# Patient Record
Sex: Male | Born: 1990
Health system: Southern US, Community
[De-identification: ages and names within clinical notes are randomized; demographics above are authoritative.]

## PROBLEM LIST (undated history)

## (undated) DIAGNOSIS — E119 Type 2 diabetes mellitus without complications: Secondary | ICD-10-CM

## (undated) HISTORY — DX: Type 2 diabetes mellitus without complications: E11.9

## (undated) HISTORY — PX: KNEE ARTHROSCOPY: SUR90

---
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
## 2010-03-14 IMAGING — CR DG FOOT COMPLETE 3+V*L*
1 series · 3 of 3 positions shown · non-contrast
Comparison: none

REASON FOR EXAM: PAIN IN LT FOOT AND ANKLE
COMMENTS:

PROCEDURE:     DXR - DXR FOOT LT COMP W/OBLIQUES  - August 10, 2008  [DATE]
RESULT:       There is no evidence of fracture, dislocation or malalignment.
 If there are persistent complaints of pain or persistent clinical concern,
a repeat evaluation in 7-10 days is recommended.

[Series 1: view not recorded · 0.17mm/px · 3 of 3 slices shown]
[im 1/3]
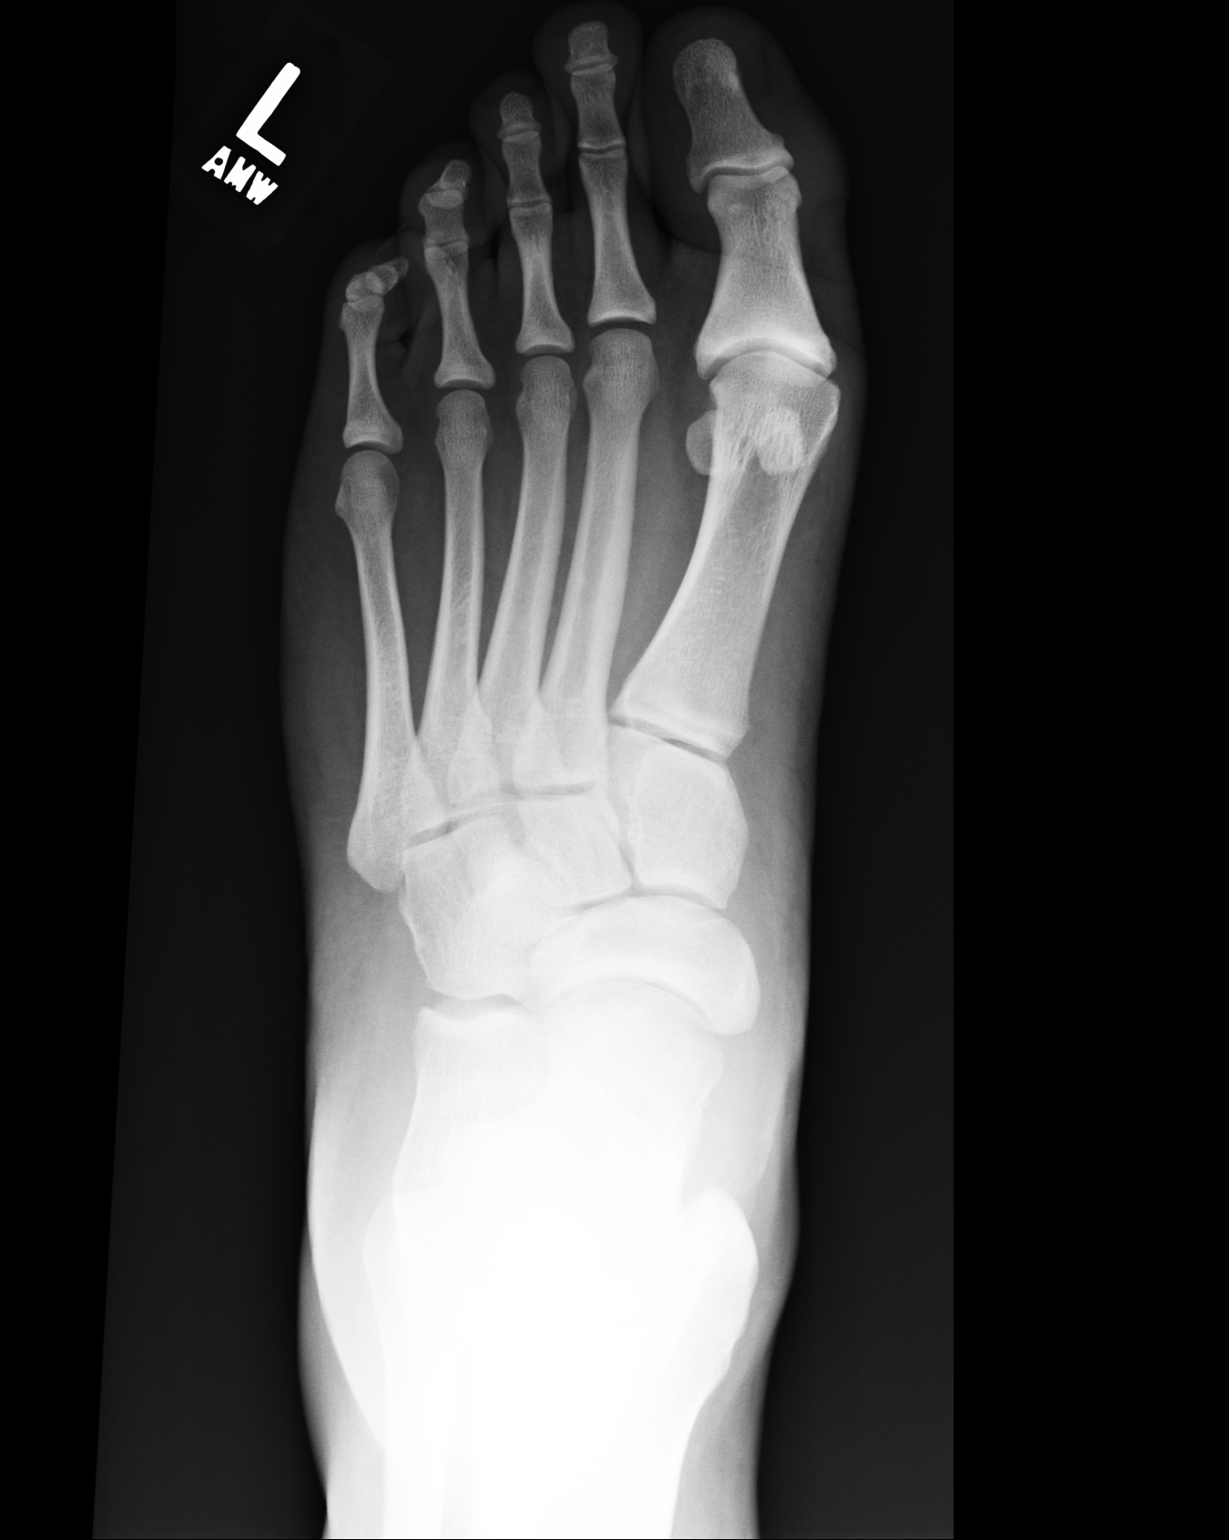
[im 2/3]
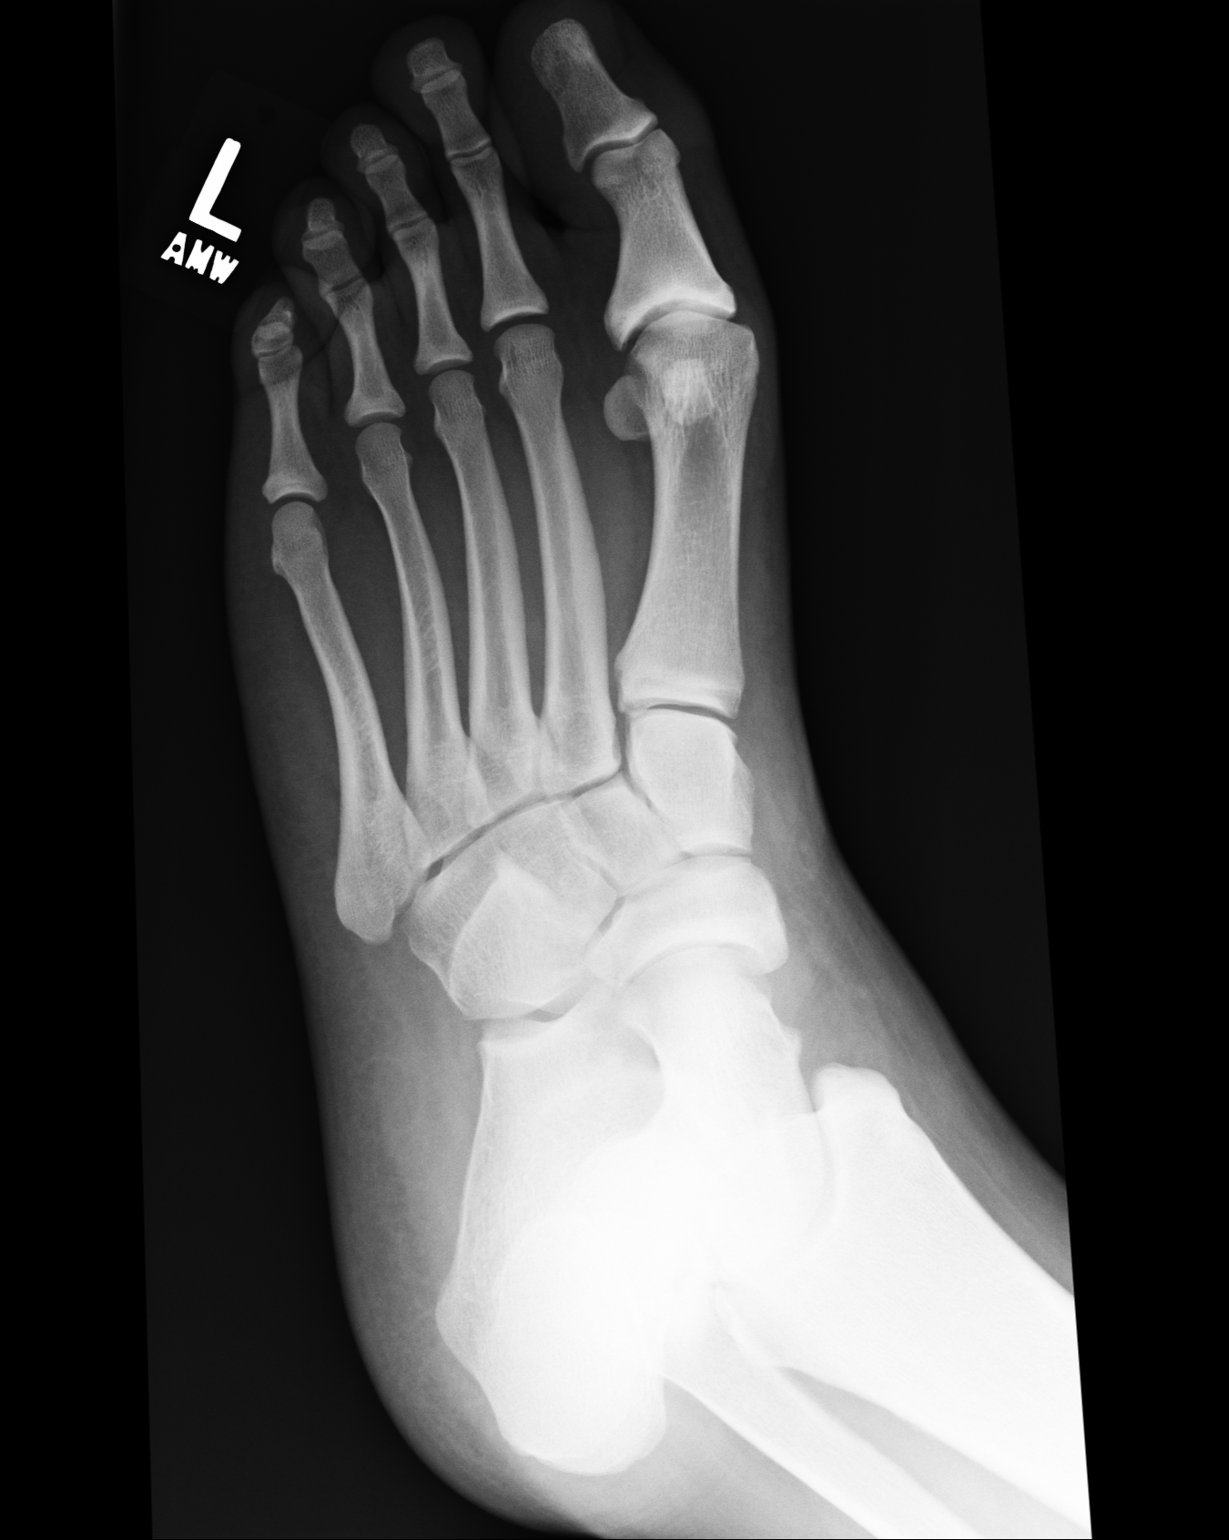
[im 3/3]
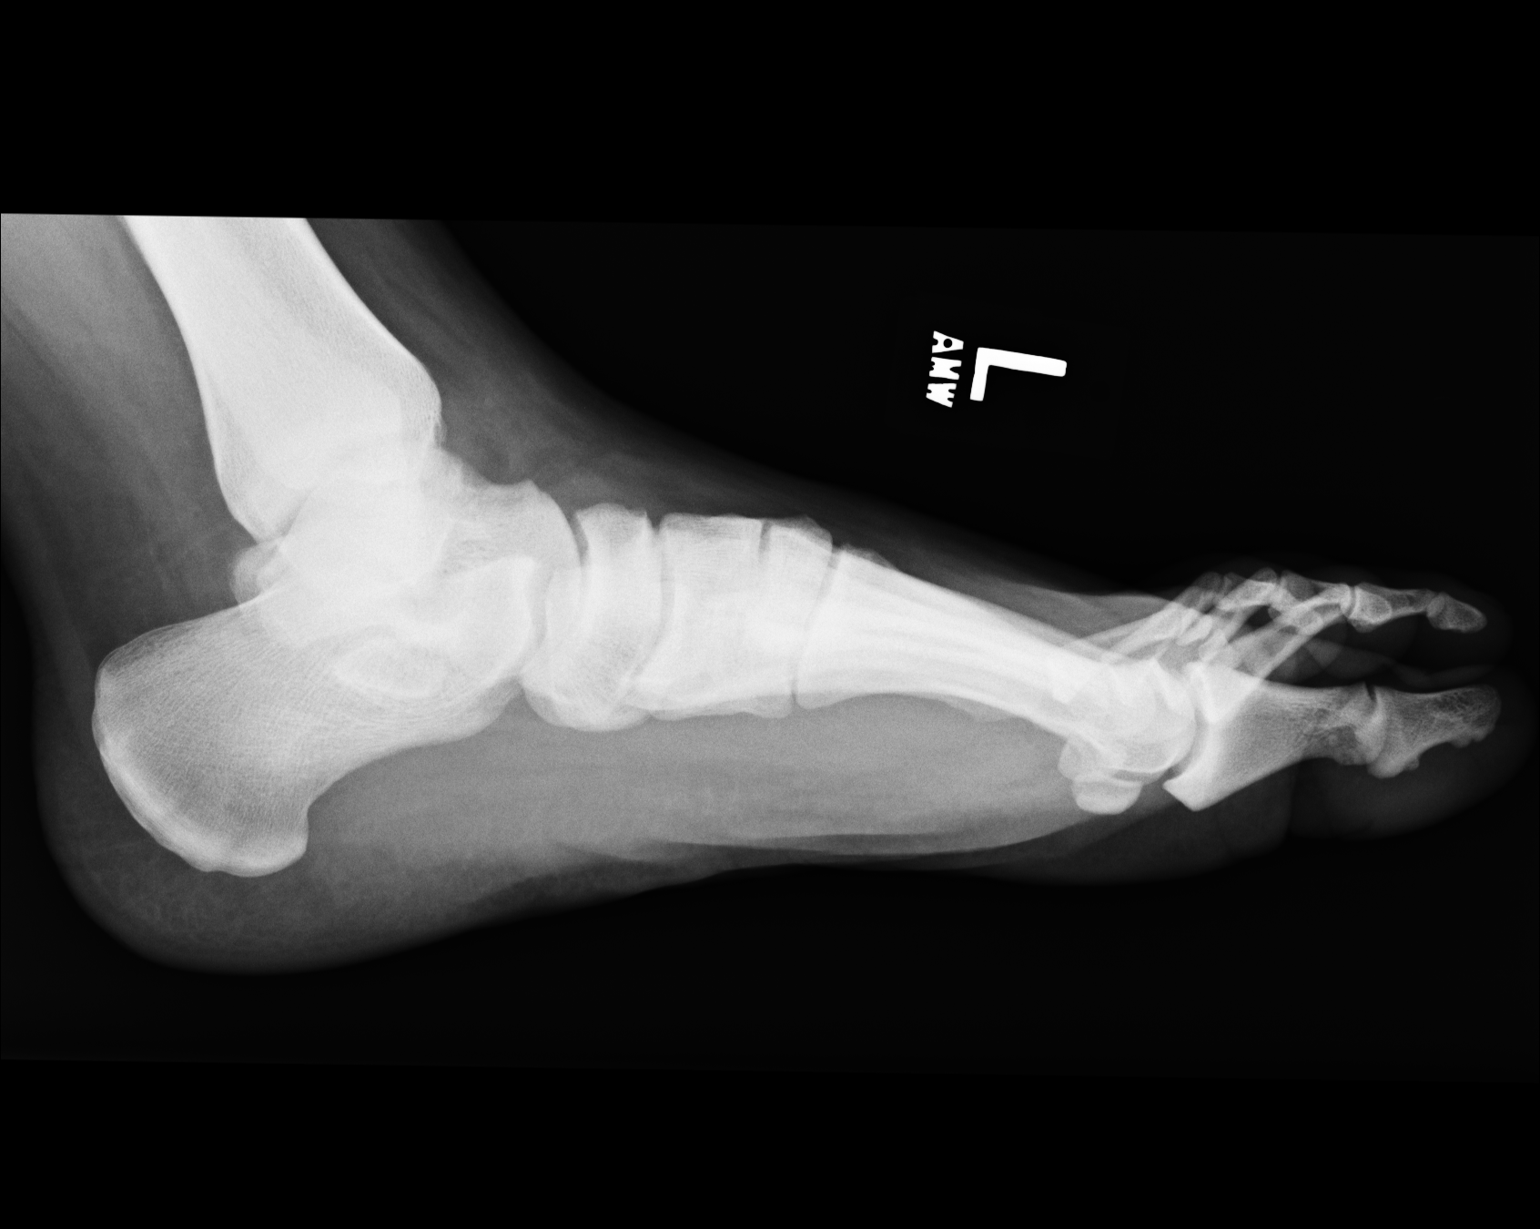

[3 of 3 positions shown; findings below may reference images not displayed]

IMPRESSION: No evidence of focal acute abnormalities of the left foot as described
above.

## 2014-09-04 DIAGNOSIS — R7309 Other abnormal glucose: Secondary | ICD-10-CM | POA: Insufficient documentation

## 2019-07-24 ENCOUNTER — Telehealth: Payer: Self-pay

## 2019-07-24 NOTE — Telephone Encounter (Signed)
Robert Whitaker will you check with Marcelino Duster if she would be willing to take this gentleman as a new patient.  He moved here from [facility] and has no provider.  If so will you see that arrangements are made for his visit?  Thank you

## 2019-07-24 NOTE — Telephone Encounter (Signed)
Scheduled patient in 09/05/2018

## 2019-07-24 NOTE — Telephone Encounter (Signed)
I spoke with Robert Whitaker.  He is from [place] and just moved here.  His insurance carrier had you on his list of providers that are taking patients.

## 2019-07-24 NOTE — Telephone Encounter (Signed)
Yeah unfortunately I am not taking new patients at this time, but maybe Marcelino Duster could establish him here.   And I think Dr. Leonard Schwartz and Ricki Rodriguez are opening up [facility] also this month.

## 2019-07-24 NOTE — Telephone Encounter (Signed)
Copied from CRM [id number]. Topic: Appointment Scheduling - Scheduling Inquiry for Clinic >> Jul 24, 2019 10:29 AM Dalphine Handing A wrote: Patient is requesting to be taken on as new patient with Joycelyn Man and also requesting a physical. Please advise

## 2019-07-24 NOTE — Telephone Encounter (Signed)
Looks like this patient has not been anywhere in [facility] system.  Chart was made on him at the time of the call.    Have left a message so I could talk to him about who he has been seeing and how he came to call and inquire about Antony Contras.

## 2019-07-24 NOTE — Telephone Encounter (Signed)
Was this patient referred by anyone?  JB

## 2019-08-27 NOTE — Progress Notes (Signed)
New patient visit    Patient: Robert Whitaker   DOB: June 15, 1990   28 y.o. Male  MRN: [id number] Visit Date: 09/05/2019  Today's healthcare provider: Jairo Ben, FNP   Rae Lips as a scribe for Beverely Pace [name], FNP.,have documented all relevant documentation on the behalf of [name] [name], FNP,as directed by  Jairo Ben, FNP while in the presence of Jairo Ben, FNP. Subjective:    Chief Complaint  Patient presents with  . New Patient (Initial Visit)    Robert Whitaker is a 29 y.o. male who presents today as a new patient to establish care.  HPI  Patient reports that he has concerns of left elbow pain that has been present for over 30 days, he states that he is predominantly left handed.   Patient does state that he has had a previous injury to elbow when he played football in past. Patient reports he notices pain more when exerting or long periods of driving. Patient states that is is a constant ache and denies radiation, but states that when pain starts he has numbness in his left ring and pinky finger that has been present for a year. Patient denies swelling of joint, he states that he does play putt putt and has had long use typing.  He has a history of meniscus tear in his right knee, he did not require surgery he reports.  He also feels like he has injured his left knee in the past.  He has right foot pain at times from previous sports injuries.  He would like to be STD tested today.  He has no symptoms currently.  Patient  denies any fever, body aches,chills, rash, chest pain, shortness of breath, nausea, vomiting, or diarrhea.    History reviewed. No pertinent past medical history. History reviewed. No pertinent surgical history. No family status information on file.   History reviewed. No pertinent family history. Social History   Socioeconomic History  . Marital status: Single    Spouse name:  Not on file  . Number of children: Not on file  . Years of education: Not on file  . Highest education level: Not on file  Occupational History  . Not on file  Tobacco Use  . Smoking status: Never Smoker  . Smokeless tobacco: Never Used  Substance and Sexual Activity  . Alcohol use: Not Currently  . Drug use: Never  . Sexual activity: Not on file  Other Topics Concern  . Not on file  Social History Narrative  . Not on file   Social Determinants of Health   Financial Resource Strain:   . Difficulty of Paying Living Expenses:   Food Insecurity:   . Worried About Programme researcher, broadcasting/film/video in the Last Year:   . Barista in the Last Year:   Transportation Needs:   . Freight forwarder (Medical):   Marland Kitchen Lack of Transportation (Non-Medical):   Physical Activity:   . Days of Exercise per Week:   . Minutes of Exercise per Session:   Stress:   . Feeling of Stress :   Social Connections:   . Frequency of Communication with Friends and Family:   . Frequency of Social Gatherings with Friends and Family:   . Attends Religious Services:   . Active Member of Clubs or Organizations:   . Attends Banker Meetings:   Marland Kitchen Marital Status:    No outpatient medications prior to  visit.   No facility-administered medications prior to visit.   Allergies  Allergen Reactions  . Bee Venom Swelling    Patient Care Team: Doreen Beam, FNP as PCP - General (Family Medicine)  Review of Systems  Constitutional: Negative for activity change, appetite change, chills, diaphoresis, fatigue, fever and unexpected weight change.  HENT: Negative.   Eyes: Negative.        He has regular eye exams.  Respiratory: Negative for apnea, cough, choking, chest tightness, shortness of breath, wheezing and stridor.   Cardiovascular: Negative for chest pain, palpitations and leg swelling.  Gastrointestinal: Negative.   Genitourinary: Negative.   Musculoskeletal: Positive for arthralgias  and joint swelling. Negative for back pain, gait problem, myalgias, neck pain and neck stiffness.       Chronic history of sports injuries bilateral knees.  Right foot pain, history of Achilles tear in the past.  Skin: Negative.   Allergic/Immunologic: Positive for environmental allergies (Allergic to bee venom, never had an anaphylactic reaction in the past but does have an epinepherine pen  is aware how to use it.).  Neurological: Positive for numbness (Small digit and ring finger of left hand.). Negative for dizziness, tremors, seizures, syncope, facial asymmetry, speech difficulty, weakness, light-headedness and headaches.  Hematological: Negative.   Psychiatric/Behavioral: Negative.   All other systems reviewed and are negative.   Last CBC No results found for: WBC, HGB, HCT, MCV, MCH, RDW, PLT Last metabolic panel No results found for: GLUCOSE, NA, K, CL, CO2, BUN, CREATININE, GFRNONAA, GFRAA, CALCIUM, PHOS, PROT, ALBUMIN, LABGLOB, AGRATIO, BILITOT, ALKPHOS, AST, ALT, ANIONGAP Last lipids No results found for: CHOL, HDL, LDLCALC, LDLDIRECT, TRIG, CHOLHDL Last hemoglobin A1c No results found for: HGBA1C Last thyroid functions No results found for: TSH, T3TOTAL, T4TOTAL, THYROIDAB Last vitamin D No results found for: 25OHVITD2, 25OHVITD3, VD25OH Last vitamin B12 and Folate No results found for: VITAMINB12, FOLATE     Objective:    BP 134/84   Pulse (!) 103   Temp 98.2 F (36.8 C) (Oral)   Resp 16   Ht 5\' 10"  (1.778 m)   Wt (!) 346 lb (156.9 kg)   SpO2 94%   BMI 49.65 kg/m  Physical Exam Vitals reviewed. Exam conducted with a chaperone present.  Constitutional:      General: He is not in acute distress.    Appearance: Normal appearance. He is obese. He is not ill-appearing, toxic-appearing or diaphoretic.  HENT:     Head: Normocephalic and atraumatic.     Right Ear: Tympanic membrane, ear canal and external ear normal. There is no impacted cerumen.     Left Ear: There  is impacted cerumen (hard dark cerumen, unable to visualize tympanic membrane).     Nose: Nose normal. No congestion or rhinorrhea.     Mouth/Throat:     Mouth: Mucous membranes are moist.     Pharynx: Oropharynx is clear. No oropharyngeal exudate or posterior oropharyngeal erythema.  Eyes:     General: No scleral icterus.    Extraocular Movements: Extraocular movements intact.     Conjunctiva/sclera: Conjunctivae normal.     Pupils: Pupils are equal, round, and reactive to light.  Cardiovascular:     Rate and Rhythm: Normal rate and regular rhythm.     Pulses: Normal pulses.     Heart sounds: Normal heart sounds. No murmur. No friction rub. No gallop.   Pulmonary:     Effort: Pulmonary effort is normal. No respiratory distress.  Breath sounds: Normal breath sounds. No stridor. No wheezing, rhonchi or rales.  Chest:     Chest wall: No tenderness.  Abdominal:     General: Bowel sounds are normal. There is no distension.     Palpations: Abdomen is soft.     Tenderness: There is no abdominal tenderness. There is no right CVA tenderness, left CVA tenderness or guarding.  Genitourinary:    Penis: Normal.      Testes: Normal. Cremasteric reflex is present.        Right: Mass, tenderness or swelling not present.        Left: Mass, tenderness or swelling not present.     Epididymis:     Right: Normal. Not inflamed. No tenderness.     Left: Normal. Not inflamed or enlarged. No mass or tenderness.     Tanner stage (genital): 5.  Musculoskeletal:        General: No tenderness or deformity. Normal range of motion.     Cervical back: Normal range of motion and neck supple.     Comments: Patient reports swelling in left knee intermittent.  history of right meniscus tear.   Skin:    General: Skin is warm and dry.     Capillary Refill: Capillary refill takes less than 2 seconds.  Neurological:     General: No focal deficit present.     Mental Status: He is alert and oriented to person,  place, and time. Mental status is at baseline.     Cranial Nerves: No cranial nerve deficit.     Sensory: No sensory deficit.     Motor: No weakness.     Coordination: Coordination normal.     Gait: Gait normal.     Deep Tendon Reflexes: Reflexes normal.  Psychiatric:        Mood and Affect: Mood normal.        Behavior: Behavior normal.        Thought Content: Thought content normal.        Judgment: Judgment normal.   Negative Phalen's and Tinel's test in the office.  He does have numbness of his left finger and ring finger at times no numbness currently. He has a history of multiple sores on injuries referral to orthopedics will be made.  Patient appers well, not sickly. Speaking in complete sentences. Patient moves on and off of exam table and in room without difficulty. Gait is normal in hall and in room. Patient is oriented to person place time and situation. Patient answers questions appropriately and engages eye contact and verbal dialect with provider.   No results found for any visits on 09/05/19.    Assessment & Plan:      Encounter for routine adult health examination without abnormal findings - Plan: Comprehensive metabolic panel  Body mass index (BMI) of 45.0-49.9 in adult [facility]) - Plan: Lipid panel, TSH  Elevated hemoglobin A1c - Plan: CBC with Differential/Platelet, Hemoglobin A1c  Left knee pain, unspecified chronicity - Plan: Ambulatory referral to Orthopedic Surgery  Screening for STD (sexually transmitted disease) - Plan: HIV antibody (with reflex), RPR, Urine cytology ancillary only, HSV(herpes simplex vrs) 1+2 ab-IgM, Hepatitis, Acute  Morbid obesity (HCC) - Plan: Comprehensive metabolic panel, Lipid panel, TSH  Bee allergy status - Plan: EPINEPHrine 0.3 mg/0.3 mL IJ SOAJ injection  - EPINEPHrine 0.3 mg/0.3 mL IJ SOAJ injection; Inject 0.3 mLs (0.3 mg total) into the muscle as needed for anaphylaxis.  Dispense: 1 each; Refill: 1 Instructions given on  use and  calling 911 if used.  Advised to review with pharmacy at pick up as well.  Inform office if has used.   The counter anti-inflammatories recommended, carpal tunnel brace to left wrist and carpal tunnel exercises given.  If not improving will have him follow-up with orthopedic hand surgeon.  Will let me know if he needs a referral.  Or if symptoms or not improving or worsening. He denies any shoulder or neck pain. He is aware that it is not heard from orthopedic regarding his orthopedic referral within 2 weeks that he will give the office a call to inform us.  Visit summary was given and reviewed. Advised patient call the office or your primary care doctor for an appointment if no improvement within 72 hours or if any symptoms change or worsen at any time  Advised ER or urgent Care if after hours or on weekend. Call 911 for emergency symptoms at any time.Patinet verbalized understanding of all instructions given/reviewed and treatment plan and has no further questions or concerns at this time.   Return in about 3 months (around 12/05/2019), or if symptoms worsen or fail to improve, for at any time for any worsening symptoms, Go to Emergency room/ urgent care if worse. IBeverely Pace Ethridge Sollenberger, FNP, have reviewed all documentation for this visit. The documentation on 09/05/19 for the exam, diagnosis, procedures, and orders are all accurate and complete.   Jairo Ben, FNP  [facility] [phone] (phone) [phone] (fax)  [facility]

## 2019-09-05 ENCOUNTER — Encounter: Payer: Self-pay | Admitting: Adult Health

## 2019-09-05 ENCOUNTER — Ambulatory Visit (INDEPENDENT_AMBULATORY_CARE_PROVIDER_SITE_OTHER): Payer: Managed Care, Other (non HMO) | Admitting: Adult Health

## 2019-09-05 ENCOUNTER — Other Ambulatory Visit: Payer: Self-pay

## 2019-09-05 ENCOUNTER — Other Ambulatory Visit (HOSPITAL_COMMUNITY)
Admission: RE | Admit: 2019-09-05 | Discharge: 2019-09-05 | Disposition: A | Discharge status: Home / Self Care | Payer: Managed Care, Other (non HMO) | Source: Ambulatory Visit | Attending: Adult Health | Admitting: Adult Health

## 2019-09-05 VITALS — BP 134/84 | HR 103 | Temp 98.2°F | Resp 16 | Ht 70.0 in | Wt 346.0 lb

## 2019-09-05 DIAGNOSIS — Z6841 Body Mass Index (BMI) 40.0 and over, adult: Secondary | ICD-10-CM

## 2019-09-05 DIAGNOSIS — Z0000 Encounter for general adult medical examination without abnormal findings: Secondary | ICD-10-CM

## 2019-09-05 DIAGNOSIS — M25562 Pain in left knee: Secondary | ICD-10-CM | POA: Diagnosis not present

## 2019-09-05 DIAGNOSIS — Z113 Encounter for screening for infections with a predominantly sexual mode of transmission: Secondary | ICD-10-CM | POA: Diagnosis present

## 2019-09-05 DIAGNOSIS — Z91030 Bee allergy status: Secondary | ICD-10-CM | POA: Insufficient documentation

## 2019-09-05 DIAGNOSIS — R7309 Other abnormal glucose: Secondary | ICD-10-CM | POA: Diagnosis not present

## 2019-09-05 MED ORDER — EPINEPHRINE 0.3 MG/0.3ML IJ SOAJ: 0.3000 mg | INTRAMUSCULAR | 1 refills | Status: DC | PRN

## 2019-09-05 NOTE — Patient Instructions (Addendum)
Health Maintenance, Male Adopting a healthy lifestyle and getting preventive care are important in promoting health and wellness. Ask your health care provider about:  The right schedule for you to have regular tests and exams.  Things you can do on your own to prevent diseases and keep yourself healthy. What should I know about diet, weight, and exercise? Eat a healthy diet   Eat a diet that includes plenty of vegetables, fruits, low-fat dairy products, and lean protein.  Do not eat a lot of foods that are high in solid fats, added sugars, or sodium. Maintain a healthy weight Body mass index (BMI) is a measurement that can be used to identify possible weight problems. It estimates body fat based on height and weight. Your health care provider can help determine your BMI and help you achieve or maintain a healthy weight. Get regular exercise Get regular exercise. This is one of the most important things you can do for your health. Most adults should:  Exercise for at least 150 minutes each week. The exercise should increase your heart rate and make you sweat (moderate-intensity exercise).  Do strengthening exercises at least twice a week. This is in addition to the moderate-intensity exercise.  Spend less time sitting. Even light physical activity can be beneficial. Watch cholesterol and blood lipids Have your blood tested for lipids and cholesterol at 30 years of age, then have this test every 5 years. You may need to have your cholesterol levels checked more often if:  Your lipid or cholesterol levels are high.  You are older than 30 years of age.  You are at high risk for heart disease. What should I know about cancer screening? Many types of cancers can be detected early and may often be prevented. Depending on your health history and family history, you may need to have cancer screening at various ages. This may include screening for:  Colorectal cancer.  Prostate  cancer.  Skin cancer.  Lung cancer. What should I know about heart disease, diabetes, and high blood pressure? Blood pressure and heart disease  High blood pressure causes heart disease and increases the risk of stroke. This is more likely to develop in people who have high blood pressure readings, are of African descent, or are overweight.  Talk with your health care provider about your target blood pressure readings.  Have your blood pressure checked: ? Every 3-5 years if you are 18-39 years of age. ? Every year if you are 40 years old or older.  If you are between the ages of 65 and 75 and are a current or former smoker, ask your health care provider if you should have a one-time screening for abdominal aortic aneurysm (AAA). Diabetes Have regular diabetes screenings. This checks your fasting blood sugar level. Have the screening done:  Once every three years after age 45 if you are at a normal weight and have a low risk for diabetes.  More often and at a younger age if you are overweight or have a high risk for diabetes. What should I know about preventing infection? Hepatitis B If you have a higher risk for hepatitis B, you should be screened for this virus. Talk with your health care provider to find out if you are at risk for hepatitis B infection. Hepatitis C Blood testing is recommended for:  Everyone born from 1945 through 1965.  Anyone with known risk factors for hepatitis C. Sexually transmitted infections (STIs)  You should be screened each year   for STIs, including gonorrhea and chlamydia, if: ? You are sexually active and are younger than 30 years of age. ? You are older than 30 years of age and your health care provider tells you that you are at risk for this type of infection. ? Your sexual activity has changed since you were last screened, and you are at increased risk for chlamydia or gonorrhea. Ask your health care provider if you are at risk.  Ask your  health care provider about whether you are at high risk for HIV. Your health care provider may recommend a prescription medicine to help prevent HIV infection. If you choose to take medicine to prevent HIV, you should first get tested for HIV. You should then be tested every 3 months for as long as you are taking the medicine. Follow these instructions at home: Lifestyle  Do not use any products that contain nicotine or tobacco, such as cigarettes, e-cigarettes, and chewing tobacco. If you need help quitting, ask your health care provider.  Do not use street drugs.  Do not share needles.  Ask your health care provider for help if you need support or information about quitting drugs. Alcohol use  Do not drink alcohol if your health care provider tells you not to drink.  If you drink alcohol: ? Limit how much you have to 0-2 drinks a day. ? Be aware of how much alcohol is in your drink. In the [place] one drink equals one 12 oz bottle of beer (355 mL), one 5 oz glass of wine (148 mL), or one 1 oz glass of hard liquor (44 mL). General instructions  Schedule regular health, dental, and eye exams.  Stay current with your vaccines.  Tell your health care provider if: ? You often feel depressed. ? You have ever been abused or do not feel safe at home. Summary  Adopting a healthy lifestyle and getting preventive care are important in promoting health and wellness.  Follow your health care provider's instructions about healthy diet, exercising, and getting tested or screened for diseases.  Follow your health care provider's instructions on monitoring your cholesterol and blood pressure. This information is not intended to replace advice given to you by your health care provider. Make sure you discuss any questions you have with your health care provider. Document Revised: 04/17/2018 Document Reviewed: 04/17/2018 Elsevier Patient Education  2020 [employer].   Epinephrine injection  (Auto-injector) What is this medicine? EPINEPHRINE (ep i NEF rin) is used for the emergency treatment of severe allergic reactions. You should keep this medicine with you at all times. This medicine may be used for other purposes; ask your health care provider or pharmacist if you have questions. COMMON BRAND NAME(S): Adrenaclick, Auvi-Q, Epinephrine Professional EMS, epinephrinesnap, epinephrinesnap-v, EpiPen, EPIsnap Epinephrine, SYMJEPI, Twinject What should I tell my health care provider before I take this medicine? They need to know if you have any of the following conditions:  diabetes  heart disease  high blood pressure  lung or breathing disease, like asthma  Parkinson's disease  thyroid disease  an unusual or allergic reaction to epinephrine, sulfites, other medicines, foods, dyes, or preservatives  pregnant or trying to get pregnant  breast-feeding How should I use this medicine? This medicine is for injection into the outer thigh. Your doctor or health care professional will instruct you on the proper use of the device during an emergency. Read all directions carefully and make sure you understand them. Do not use more often than  directed. Talk to your pediatrician regarding the use of this medicine in children. Special care may be needed. This drug is commonly used in children. A special device is available for use in children. If you are giving this medicine to a young child, hold their leg firmly in place before and during the injection to prevent injury. Overdosage: If you think you have taken too much of this medicine contact a poison control center or emergency room at once. NOTE: This medicine is only for you. Do not share this medicine with others. What if I miss a dose? This does not apply. You should only use this medicine for an allergic reaction. What may interact with this medicine? This medicine is only used during an emergency. Significant drug interactions  are not likely during emergency use. This list may not describe all possible interactions. Give your health care provider a list of all the medicines, herbs, non-prescription drugs, or dietary supplements you use. Also tell them if you smoke, drink alcohol, or use illegal drugs. Some items may interact with your medicine. What should I watch for while using this medicine? Keep this medicine ready for use in the case of a severe allergic reaction. Make sure that you have the phone number of your doctor or health care professional and local hospital ready. Remember to check the expiration date of your medicine regularly. You may need to have additional units of this medicine with you at work, school, or other places. Talk to your doctor or health care professional about your need for extra units. Some emergencies may require an additional dose. Check with your doctor or a health care professional before using an extra dose. After use, go to the nearest hospital or call 911. Avoid physical activity. Make sure the treating health care professional knows you have received an injection of this medicine. You will receive additional instructions on what to do during and after use of this medicine before a medical emergency occurs. What side effects may I notice from receiving this medicine? Side effects that you should report to your doctor or health care professional as soon as possible:  allergic reactions like skin rash, itching or hives, swelling of the face, lips, or tongue  breathing problems  chest pain  fast, irregular heartbeat  pain, tingling, numbness in the hands or feet  pain, redness, or irritation at site where injected  vomiting Side effects that usually do not require medical attention (report to your doctor or health care professional if they continue or are bothersome):  anxious  dizziness  dry mouth  headache  increased sweating  nausea  unusually weak or tired This  list may not describe all possible side effects. Call your doctor for medical advice about side effects. You may report side effects to FDA at 1-800-FDA-1088. Where should I keep my medicine? Keep out of the reach of children. Store at room temperature between 15 and 30 degrees C (59 and 86 degrees F). Protect from light and heat. The solution should be clear in color. If the solution is discolored or contains particles it must be replaced. Throw away any unused medicine after the expiration date. Ask your doctor or pharmacist about proper disposal of the injector if it is expired or has been used. Always replace your auto-injector before it expires. NOTE: This sheet is a summary. It may not cover all possible information. If you have questions about this medicine, talk to your doctor, pharmacist, or health care provider.  2020  Elsevier/Gold Standard (2014-09-28 12:24:50)    Earwax Buildup, Adult The ears produce a substance called earwax that helps keep bacteria out of the ear and protects the skin in the ear canal. Occasionally, earwax can build up in the ear and cause discomfort or hearing loss. What increases the risk? This condition is more likely to develop in people who:  Are male.  Are elderly.  Naturally produce more earwax.  Clean their ears often with cotton swabs.  Use earplugs often.  Use in-ear headphones often.  Wear hearing aids.  Have narrow ear canals.  Have earwax that is overly thick or sticky.  Have eczema.  Are dehydrated.  Have excess hair in the ear canal. What are the signs or symptoms? Symptoms of this condition include:  Reduced or muffled hearing.  A feeling of fullness in the ear or feeling that the ear is plugged.  Fluid coming from the ear.  Ear pain.  Ear itch.  Ringing in the ear.  Coughing.  An obvious piece of earwax that can be seen inside the ear canal. How is this diagnosed? This condition may be diagnosed based  on:  Your symptoms.  Your medical history.  An ear exam. During the exam, your health care provider will look into your ear with an instrument called an otoscope. You may have tests, including a hearing test. How is this treated? This condition may be treated by:  Using ear drops to soften the earwax.  Having the earwax removed by a health care provider. The health care provider may: ? Flush the ear with water. ? Use an instrument that has a loop on the end (curette). ? Use a suction device.  Surgery to remove the wax buildup. This may be done in severe cases. Follow these instructions at home:   Take over-the-counter and prescription medicines only as told by your health care provider.  Do not put any objects, including cotton swabs, into your ear. You can clean the opening of your ear canal with a washcloth or facial tissue.  Follow instructions from your health care provider about cleaning your ears. Do not over-clean your ears.  Drink enough fluid to keep your urine clear or pale yellow. This will help to thin the earwax.  Keep all follow-up visits as told by your health care provider. If earwax builds up in your ears often or if you use hearing aids, consider seeing your health care provider for routine, preventive ear cleanings. Ask your health care provider how often you should schedule your cleanings.  If you have hearing aids, clean them according to instructions from the manufacturer and your health care provider. Contact a health care provider if:  You have ear pain.  You develop a fever.  You have blood, pus, or other fluid coming from your ear.  You have hearing loss.  You have ringing in your ears that does not go away.  Your symptoms do not improve with treatment.  You feel like the room is spinning (vertigo). Summary  Earwax can build up in the ear and cause discomfort or hearing loss.  The most common symptoms of this condition include reduced or  muffled hearing and a feeling of fullness in the ear or feeling that the ear is plugged.  This condition may be diagnosed based on your symptoms, your medical history, and an ear exam.  This condition may be treated by using ear drops to soften the earwax or by having the earwax removed by a  health care provider.  Do not put any objects, including cotton swabs, into your ear. You can clean the opening of your ear canal with a washcloth or facial tissue. This information is not intended to replace advice given to you by your health care provider. Make sure you discuss any questions you have with your health care provider.   Carpal Tunnel Syndrome  Carpal tunnel syndrome is a condition that causes pain in your hand and arm. The carpal tunnel is a narrow area that is on the palm side of your wrist. Repeated wrist motion or certain diseases may cause swelling in the tunnel. This swelling can pinch the main nerve in the wrist (median nerve). What are the causes? This condition may be caused by:  Repeated wrist motions.  Wrist injuries.  Arthritis.  A sac of fluid (cyst) or abnormal growth (tumor) in the carpal tunnel.  Fluid buildup during pregnancy. Sometimes the cause is not known. What increases the risk? The following factors may make you more likely to develop this condition:  Having a job in which you move your wrist in the same way many times. This includes jobs like being a Midwife or a Conservation officer, nature.  Being a woman.  Having other health conditions, such as: ? Diabetes. ? Obesity. ? A thyroid gland that is not active enough (hypothyroidism). ? Kidney failure. What are the signs or symptoms? Symptoms of this condition include:  A tingling feeling in your fingers.  Tingling or a loss of feeling (numbness) in your hand.  Pain in your entire arm. This pain may get worse when you bend your wrist and elbow for a long time.  Pain in your wrist that goes up your arm to your  shoulder.  Pain that goes down into your palm or fingers.  A weak feeling in your hands. You may find it hard to grab and hold items. You may feel worse at night. How is this diagnosed? This condition is diagnosed with a medical history and physical exam. You may also have tests, such as:  Electromyogram (EMG). This test checks the signals that the nerves send to the muscles.  Nerve conduction study. This test checks how well signals pass through your nerves.  Imaging tests, such as X-rays, ultrasound, and MRI. These tests check for what might be the cause of your condition. How is this treated? This condition may be treated with:  Lifestyle changes. You will be asked to stop or change the activity that caused your problem.  Doing exercise and activities that make bones and muscles stronger (physical therapy).  Learning how to use your hand again (occupational therapy).  Medicines for pain and swelling (inflammation). You may have injections in your wrist.  A wrist splint.  Surgery. Follow these instructions at home: If you have a splint:  Wear the splint as told by your doctor. Remove it only as told by your doctor.  Loosen the splint if your fingers: ? Tingle. ? Lose feeling (become numb). ? Turn cold and blue.  Keep the splint clean.  If the splint is not waterproof: ? Do not let it get wet. ? Cover it with a watertight covering when you take a bath or a shower. Managing pain, stiffness, and swelling   If told, put ice on the painful area: ? If you have a removable splint, remove it as told by your doctor. ? Put ice in a plastic bag. ? Place a towel between your skin and the bag. ?  Leave the ice on for 20 minutes, 2-3 times per day. General instructions  Take over-the-counter and prescription medicines only as told by your doctor.  Rest your wrist from any activity that may cause pain. If needed, talk with your boss at work about changes that can help your  wrist heal.  Do any exercises as told by your doctor, physical therapist, or occupational therapist.  Keep all follow-up visits as told by your doctor. This is important. Contact a doctor if:  You have new symptoms.  Medicine does not help your pain.  Your symptoms get worse. Get help right away if:  You have very bad numbness or tingling in your wrist or hand. Summary  Carpal tunnel syndrome is a condition that causes pain in your hand and arm.  It is often caused by repeated wrist motions.  Lifestyle changes and medicines are used to treat this problem. Surgery may help in very bad cases.  Follow your doctor's instructions about wearing a splint, resting your wrist, keeping follow-up visits, and calling for help. This information is not intended to replace advice given to you by your health care provider. Make sure you discuss any questions you have with your health care provider. Document Revised: 08/31/2017 Document Reviewed: 08/31/2017 Elsevier Patient Education  2020 Elsevier Inc.  Document Revised: 04/06/2017 Document Reviewed: 07/05/2016 Elsevier Patient Education  2020 [employer].

## 2019-09-09 LAB — URINE CYTOLOGY ANCILLARY ONLY
Chlamydia: NEGATIVE
Comment: NEGATIVE
Comment: NEGATIVE
Comment: NORMAL
Neisseria Gonorrhea: NEGATIVE
Trichomonas: NEGATIVE

## 2019-09-09 NOTE — Progress Notes (Signed)
Negative for gonorrhea, trichomonas, and chlamydia in urine.  Other labs still pending.

## 2019-09-22 ENCOUNTER — Telehealth: Payer: Self-pay

## 2019-09-22 NOTE — Telephone Encounter (Signed)
-----   Message from Berniece Pap, FNP sent at 09/22/2019  8:51 AM EDT ----- Hemoglobin A 1C and glucose elevated in diabetic range, recommend scheduling a follow up visit within one month to discuss treatment options. Diet and lifestyle increasing exercise recommended.  Cholesterol ok, HDL good cholesterol is low should be over 39 and recommend increased exercise and diet changes.  CBC within normal limits no signs of infection or anemia.  CMP elevated glucose, otherwise within normal limits electrolytes, kidney and liver function.  RPR for syphilis is negative.  Hepatitis acute A,B and C negative.  HSV antibodies still pending result.

## 2019-09-22 NOTE — Progress Notes (Signed)
Hemoglobin A 1C and glucose elevated in diabetic range, recommend scheduling a follow up visit within one month to discuss treatment options. Diet and lifestyle increasing exercise recommended.  Cholesterol ok, HDL good cholesterol is low should be over 39 and recommend increased exercise and diet changes.  CBC within normal limits no signs of infection or anemia.  CMP elevated glucose, otherwise within normal limits electrolytes, kidney and liver function.  RPR for syphilis is negative.  Hepatitis acute A,B and C negative.  HSV antibodies still pending result.

## 2019-09-22 NOTE — Telephone Encounter (Signed)
Patient was advised and scheduled follow-up appointment on 10/21/2019.

## 2019-09-23 LAB — COMPREHENSIVE METABOLIC PANEL
ALT: 38 IU/L (ref 0–44)
AST: 28 IU/L (ref 0–40)
Albumin/Globulin Ratio: 1.4 (ref 1.2–2.2)
Albumin: 4.3 g/dL (ref 4.1–5.2)
Alkaline Phosphatase: 58 IU/L (ref 39–117)
BUN/Creatinine Ratio: 14 (ref 9–20)
BUN: 17 mg/dL (ref 6–20)
Bilirubin Total: 0.5 mg/dL (ref 0.0–1.2)
CO2: 22 mmol/L (ref 20–29)
Calcium: 9.3 mg/dL (ref 8.7–10.2)
Chloride: 100 mmol/L (ref 96–106)
Creatinine, Ser: 1.21 mg/dL (ref 0.76–1.27)
GFR calc Af Amer: 93 mL/min/{1.73_m2} (ref >59)
GFR calc non Af Amer: 80 mL/min/{1.73_m2} (ref >59)
Globulin, Total: 3.1 g/dL (ref 1.5–4.5)
Glucose: 116 mg/dL — ABNORMAL HIGH (ref 65–99)
Potassium: 4.3 mmol/L (ref 3.5–5.2)
Sodium: 137 mmol/L (ref 134–144)
Total Protein: 7.4 g/dL (ref 6.0–8.5)

## 2019-09-23 LAB — LIPID PANEL
Chol/HDL Ratio: 3.2 ratio (ref 0.0–5.0)
Cholesterol, Total: 116 mg/dL (ref 100–199)
HDL: 36 mg/dL — ABNORMAL LOW (ref >39)
LDL Chol Calc (NIH): 65 mg/dL (ref 0–99)
Triglycerides: 75 mg/dL (ref 0–149)
VLDL Cholesterol Cal: 15 mg/dL (ref 5–40)

## 2019-09-23 LAB — CBC WITH DIFFERENTIAL/PLATELET
Basophils Absolute: 0 10*3/uL (ref 0.0–0.2)
Basos: 0 %
EOS (ABSOLUTE): 0 10*3/uL (ref 0.0–0.4)
Eos: 0 %
Hematocrit: 43 % (ref 37.5–51.0)
Hemoglobin: 14.3 g/dL (ref 13.0–17.7)
Immature Grans (Abs): 0 10*3/uL (ref 0.0–0.1)
Immature Granulocytes: 0 %
Lymphocytes Absolute: 1.9 10*3/uL (ref 0.7–3.1)
Lymphs: 27 %
MCH: 28.2 pg (ref 26.6–33.0)
MCHC: 33.3 g/dL (ref 31.5–35.7)
MCV: 85 fL (ref 79–97)
Monocytes Absolute: 0.5 10*3/uL (ref 0.1–0.9)
Monocytes: 7 %
Neutrophils Absolute: 4.5 10*3/uL (ref 1.4–7.0)
Neutrophils: 66 %
Platelets: 323 10*3/uL (ref 150–450)
RBC: 5.07 x10E6/uL (ref 4.14–5.80)
RDW: 14.5 % (ref 11.6–15.4)
WBC: 7 10*3/uL (ref 3.4–10.8)

## 2019-09-23 LAB — HEPATITIS PANEL, ACUTE
Hep A IgM: NEGATIVE
Hep B C IgM: NEGATIVE
Hep C Virus Ab: <0.1 s/co ratio (ref 0.0–0.9)
Hepatitis B Surface Ag: NEGATIVE

## 2019-09-23 LAB — HEMOGLOBIN A1C
Est. average glucose Bld gHb Est-mCnc: 140 mg/dL
Hgb A1c MFr Bld: 6.5 % — ABNORMAL HIGH (ref 4.8–5.6)

## 2019-09-23 LAB — HSV(HERPES SIMPLEX VRS) I + II AB-IGM: HSVI/II Comb IgM: <0.91 Ratio (ref 0.00–0.90)

## 2019-09-23 LAB — HIV ANTIBODY (ROUTINE TESTING W REFLEX): HIV Screen 4th Generation wRfx: NONREACTIVE

## 2019-09-23 LAB — RPR: RPR Ser Ql: NONREACTIVE

## 2019-09-23 LAB — TSH: TSH: 1.13 u[IU]/mL (ref 0.450–4.500)

## 2019-10-21 ENCOUNTER — Encounter: Payer: Self-pay | Admitting: Adult Health

## 2019-10-21 ENCOUNTER — Other Ambulatory Visit: Payer: Self-pay

## 2019-10-21 ENCOUNTER — Ambulatory Visit (INDEPENDENT_AMBULATORY_CARE_PROVIDER_SITE_OTHER): Payer: Managed Care, Other (non HMO) | Admitting: Adult Health

## 2019-10-21 DIAGNOSIS — E786 Lipoprotein deficiency: Secondary | ICD-10-CM | POA: Insufficient documentation

## 2019-10-21 DIAGNOSIS — E119 Type 2 diabetes mellitus without complications: Secondary | ICD-10-CM | POA: Insufficient documentation

## 2019-10-21 DIAGNOSIS — Z6841 Body Mass Index (BMI) 40.0 and over, adult: Secondary | ICD-10-CM | POA: Diagnosis not present

## 2019-10-21 MED ORDER — BLOOD GLUCOSE METER KIT: PACK | 0 refills | Status: AC

## 2019-10-21 NOTE — Patient Instructions (Addendum)
Hyperglycemia Hyperglycemia is when the sugar (glucose) level in your blood is too high. It may not cause symptoms. If you do have symptoms, they may include warning signs, such as:  Feeling more thirsty than normal.  Hunger.  Feeling tired.  Needing to pee (urinate) more than normal.  Blurry eyesight (vision). You may get other symptoms as it gets worse, such as:  Dry mouth.  Not being hungry (loss of appetite).  Fruity-smelling breath.  Weakness.  Weight gain or loss that is not planned. Weight loss may be fast.  A tingling or numb feeling in your hands or feet.  Headache.  Skin that does not bounce back quickly when it is lightly pinched and released (poor skin turgor).  Pain in your belly (abdomen).  Cuts or bruises that heal slowly. High blood sugar can happen to people who do or do not have diabetes. High blood sugar can happen slowly or quickly, and it can be an emergency. Follow these instructions at home: General instructions  Take over-the-counter and prescription medicines only as told by your doctor. Do not use products that contain nicotine or tobacco, such as cigarettes and e-cigarettes. If you need help quitting, ask your doctor. Blood Glucose Monitoring, Adult Monitoring your blood sugar (glucose) is an important part of managing your diabetes (diabetes mellitus). Blood glucose monitoring involves checking your blood glucose as often as directed and keeping a record (log) of your results over time. Checking your blood glucose regularly and keeping a blood glucose log can: Help you and your health care provider adjust your diabetes management plan as needed, including your medicines or insulin. Help you understand how food, exercise, illnesses, and medicines affect your blood glucose. Let you know what your blood glucose is at any time. You can quickly find out if you have low blood glucose (hypoglycemia) or high blood glucose (hyperglycemia). Your health  care provider will set individualized treatment goals for you. Your goals will be based on your age, other medical conditions you have, and how you respond to diabetes treatment. Generally, the goal of treatment is to maintain the following blood glucose levels: Before meals (preprandial): 80-130 mg/dL (4.4-7.2 mmol/L). After meals (postprandial): below 180 mg/dL (10 mmol/L). A1c level: less than 7%. Supplies needed: Blood glucose meter. Test strips for your meter. Each meter has its own strips. You must use the strips that came with your meter. A needle to prick your finger (lancet). Do not use a lancet more than one time. A device that holds the lancet (lancing device). A journal or log book to write down your results. How to check your blood glucose  Wash your hands with soap and water. Prick the side of your finger (not the tip) with the lancet. Use a different finger each time. Gently rub the finger until a small drop of blood appears. Follow instructions that come with your meter for inserting the test strip, applying blood to the strip, and using your blood glucose meter. Write down your result and any notes. Some meters allow you to use areas of your body other than your finger (alternative sites) to test your blood. The most common alternative sites are: Forearm. Thigh. Palm of the hand. If you think you may have hypoglycemia, or if you have a history of not knowing when your blood glucose is getting low (hypoglycemia unawareness), do not use alternative sites. Use your finger instead. Alternative sites may not be as accurate as the fingers, because blood flow is slower in  these areas. This means that the result you get may be delayed, and it may be different from the result that you would get from your finger. Follow these instructions at home: Blood glucose log  Every time you check your blood glucose, write down your result. Also write down any notes about things that may be  affecting your blood glucose, such as your diet and exercise for the day. This information can help you and your health care provider: Look for patterns in your blood glucose over time. Adjust your diabetes management plan as needed. Check if your meter allows you to download your records to a computer. Most glucose meters store a record of glucose readings in the meter. If you have type 1 diabetes: Check your blood glucose 2 or more times a day. Also check your blood glucose: Before every insulin injection. Before and after exercise. Before meals. 2 hours after a meal. Occasionally between 2:00 a.m. and 3:00 a.m., as directed. Before potentially dangerous tasks, like driving or using heavy machinery. At bedtime. You may need to check your blood glucose more often, up to 6-10 times a day, if you: Use an insulin pump. Need multiple daily injections (MDI). Have diabetes that is not well-controlled. Are ill. Have a history of severe hypoglycemia. Have hypoglycemia unawareness. If you have type 2 diabetes: If you take insulin or other diabetes medicines, check your blood glucose 2 or more times a day. If you are on intensive insulin therapy, check your blood glucose 4 or more times a day. Occasionally, you may also need to check between 2:00 a.m. and 3:00 a.m., as directed. Also check your blood glucose: Before and after exercise. Before potentially dangerous tasks, like driving or using heavy machinery. You may need to check your blood glucose more often if: Your medicine is being adjusted. Your diabetes is not well-controlled. You are ill. General tips Always keep your supplies with you. If you have questions or need help, all blood glucose meters have a 24-hour "hotline" phone number that you can call. You may also contact your health care provider. After you use a few boxes of test strips, adjust (calibrate) your blood glucose meter by following instructions that came with your  meter. Contact a health care provider if: Your blood glucose is at or above 240 mg/dL (13.3 mmol/L) for 2 days in a row. You have been sick or have had a fever for 2 days or longer, and you are not getting better. You have any of the following problems for more than 6 hours: You cannot eat or drink. You have nausea or vomiting. You have diarrhea. Get help right away if: Your blood glucose is lower than 54 mg/dL (3 mmol/L). You become confused or you have trouble thinking clearly. You have difficulty breathing. You have moderate or large ketone levels in your urine. Summary Monitoring your blood sugar (glucose) is an important part of managing your diabetes (diabetes mellitus). Blood glucose monitoring involves checking your blood glucose as often as directed and keeping a record (log) of your results over time. Your health care provider will set individualized treatment goals for you. Your goals will be based on your age, other medical conditions you have, and how you respond to diabetes treatment. Every time you check your blood glucose, write down your result. Also write down any notes about things that may be affecting your blood glucose, such as your diet and exercise for the day. This information is not intended to replace advice  given to you by your health care provider. Make sure you discuss any questions you have with your health care provider. Document Revised: 02/15/2018 Document Reviewed: 10/04/2015 Elsevier Patient Education  [place].    Limit alcohol intake to no more than 1 drink per day for nonpregnant women and 2 drinks per day for men. One drink equals 12 oz of beer, 5 oz of wine, or 1 oz of hard liquor.  Manage stress. If you need help with this, ask your doctor.  Keep all follow-up visits as told by your doctor. This is important. Eating and drinking   Stay at a healthy weight.  Exercise regularly, as told by your doctor.  Drink enough fluid,  especially when you: ? Exercise. ? Get sick. ? Are in hot temperatures.  Eat healthy foods, such as: ? Low-fat (lean) proteins. ? Complex carbs (complex carbohydrates), such as whole wheat bread or brown rice. ? Fresh fruits and vegetables. ? Low-fat dairy products. ? Healthy fats.  Drink enough fluid to keep your pee (urine) clear or pale yellow. If you have diabetes:   Make sure you know the symptoms of hyperglycemia.  Follow your diabetes management plan, as told by your doctor. Make sure you: ? Take insulin and medicines as told. ? Follow your exercise plan. ? Follow your meal plan. Eat on time. Do not skip meals. ? Check your blood sugar as often as told. Make sure to check before and after exercise. If you exercise longer or in a different way than you normally do, check your blood sugar more often. ? Follow your sick day plan whenever you cannot eat or drink normally. Make this plan ahead of time with your doctor.  Share your diabetes management plan with people in your workplace, school, and household.  Check your urine for ketones when you are ill and as told by your doctor.  Carry a card or wear jewelry that says that you have diabetes. Contact a doctor if:  Your blood sugar level is higher than 240 mg/dL (13.3 mmol/L) for 2 days in a row.  You have problems keeping your blood sugar in your target range.  High blood sugar happens often for you. Get help right away if:  You have trouble breathing.  You have a change in how you think, feel, or act (mental status).  You feel sick to your stomach (nauseous), and that feeling does not go away.  You cannot stop throwing up (vomiting). These symptoms may be an emergency. Do not wait to see if the symptoms will go away. Get medical help right away. Call your local emergency services (911 in the [place] Do not drive yourself to the hospital. Summary  Hyperglycemia is when the sugar (glucose) level in your blood is too  high.  High blood sugar can happen to people who do or do not have diabetes.  Make sure you drink enough fluids, eat healthy foods, and exercise regularly.  Contact your doctor if you have problems keeping your blood sugar in your target range. This information is not intended to replace advice given to you by your health care provider. Make sure you discuss any questions you have with your health care provider. Document Revised: 01/10/2016 Document Reviewed: 01/10/2016 Elsevier Patient Education  [place]. Hypoglycemia Hypoglycemia is when the sugar (glucose) level in your blood is too low. Signs of low blood sugar may include:  Feeling: ? Hungry. ? Worried or nervous (anxious). ? Sweaty and clammy. ?  Confused. ? Dizzy. ? Sleepy. ? Sick to your stomach (nauseous).  Having: ? A fast heartbeat. ? A headache. ? A change in your vision. ? Tingling or no feeling (numbness) around your mouth, lips, or tongue. ? Jerky movements that you cannot control (seizure).  Having trouble with: ? Moving (coordination). ? Sleeping. ? Passing out (fainting). ? Getting upset easily (irritability). Low blood sugar can happen to people who have diabetes and people who do not have diabetes. Low blood sugar can happen quickly, and it can be an emergency. Treating low blood sugar Low blood sugar is often treated by eating or drinking something sugary right away, such as:  Fruit juice, 4-6 oz (120-150 mL).  Regular soda (not diet soda), 4-6 oz (120-150 mL).  Low-fat milk, 4 oz (120 mL).  Several pieces of hard candy.  Sugar or honey, 1 Tbsp (15 mL). Treating low blood sugar if you have diabetes If you can think clearly and swallow safely, follow the 15:15 rule:  Take 15 grams of a fast-acting carb (carbohydrate). Talk with your doctor about how much you should take.  Always keep a source of fast-acting carb with you, such as: ? Sugar tablets (glucose pills). Take 3-4  pills. ? 6-8 pieces of hard candy. ? 4-6 oz (120-150 mL) of fruit juice. ? 4-6 oz (120-150 mL) of regular (not diet) soda. ? 1 Tbsp (15 mL) honey or sugar.  Check your blood sugar 15 minutes after you take the carb.  If your blood sugar is still at or below 70 mg/dL (3.9 mmol/L), take 15 grams of a carb again.  If your blood sugar does not go above 70 mg/dL (3.9 mmol/L) after 3 tries, get help right away.  After your blood sugar goes back to normal, eat a meal or a snack within 1 hour.  Treating very low blood sugar If your blood sugar is at or below 54 mg/dL (3 mmol/L), you have very low blood sugar (severe hypoglycemia). This may also cause:  Passing out.  Jerky movements you cannot control (seizure).  Losing consciousness (coma). This is an emergency. Do not wait to see if the symptoms will go away. Get medical help right away. Call your local emergency services (911 in the [place] Do not drive yourself to the hospital. If you have very low blood sugar and you cannot eat or drink, you may need a glucagon shot (injection). A family member or friend should learn how to check your blood sugar and how to give you a glucagon shot. Ask your doctor if you need to have a glucagon shot kit at home. Follow these instructions at home: General instructions  Take over-the-counter and prescription medicines only as told by your doctor.  Stay aware of your blood sugar as told by your doctor.  Limit alcohol intake to no more than 1 drink a day for nonpregnant women and 2 drinks a day for men. One drink equals 12 oz of beer (355 mL), 5 oz of wine (148 mL), or 1 oz of hard liquor (44 mL).  Keep all follow-up visits as told by your doctor. This is important. If you have diabetes:   Follow your diabetes care plan as told by your doctor. Make sure you: ? Know the signs of low blood sugar. ? Take your medicines as told. ? Follow your exercise and meal plan. ? Eat on time. Do not skip  meals. ? Check your blood sugar as often as told by your doctor. Always  check it before and after exercise. ? Follow your sick day plan when you cannot eat or drink normally. Make this plan ahead of time with your doctor.  Share your diabetes care plan with: ? Your work or school. ? People you live with.  Check your pee (urine) for ketones: ? When you are sick. ? As told by your doctor.  Carry a card or wear jewelry that says you have diabetes. Contact a doctor if:  You have trouble keeping your blood sugar in your target range.  You have low blood sugar often. Get help right away if:  You still have symptoms after you eat or drink something sugary.  Your blood sugar is at or below 54 mg/dL (3 mmol/L).  You have jerky movements that you cannot control.  You pass out. These symptoms may be an emergency. Do not wait to see if the symptoms will go away. Get medical help right away. Call your local emergency services (911 in the [place] Do not drive yourself to the hospital. Summary  Hypoglycemia happens when the level of sugar (glucose) in your blood is too low.  Low blood sugar can happen to people who have diabetes and people who do not have diabetes. Low blood sugar can happen quickly, and it can be an emergency.  Make sure you know the signs of low blood sugar and know how to treat it.  Always keep a source of sugar (fast-acting carb) with you to treat low blood sugar. This information is not intended to replace advice given to you by your health care provider. Make sure you discuss any questions you have with your health care provider. Document Revised: 08/15/2018 Document Reviewed: 05/28/2015 Elsevier Patient Education  2020 [place]. Diabetes Basics  Diabetes (diabetes mellitus) is a long-term (chronic) disease. It occurs when the body does not properly use sugar (glucose) that is released from food after you eat. Diabetes may be caused by one or both of these  problems:  Your pancreas does not make enough of a hormone called insulin.  Your body does not react in a normal way to insulin that it makes. Insulin lets sugars (glucose) go into cells in your body. This gives you energy. If you have diabetes, sugars cannot get into cells. This causes high blood sugar (hyperglycemia). Follow these instructions at home: How is diabetes treated? You may need to take insulin or other diabetes medicines daily to keep your blood sugar in balance. Take your diabetes medicines every day as told by your doctor. List your diabetes medicines here: Diabetes medicines  Name of medicine: ______________________________ ? Amount (dose): _______________ Time (a.m./p.m.): _______________ Notes: ___________________________________  Name of medicine: ______________________________ ? Amount (dose): _______________ Time (a.m./p.m.): _______________ Notes: ___________________________________  Name of medicine: ______________________________ ? Amount (dose): _______________ Time (a.m./p.m.): _______________ Notes: ___________________________________ If you use insulin, you will learn how to give yourself insulin by injection. You may need to adjust the amount based on the food that you eat. List the types of insulin you use here: Insulin  Insulin type: ______________________________ ? Amount (dose): _______________ Time (a.m./p.m.): _______________ Notes: ___________________________________  Insulin type: ______________________________ ? Amount (dose): _______________ Time (a.m./p.m.): _______________ Notes: ___________________________________  Insulin type: ______________________________ ? Amount (dose): _______________ Time (a.m./p.m.): _______________ Notes: ___________________________________  Insulin type: ______________________________ ? Amount (dose): _______________ Time (a.m./p.m.): _______________ Notes: ___________________________________  Insulin type:  ______________________________ ? Amount (dose): _______________ Time (a.m./p.m.): _______________ Notes: ___________________________________ How do I manage my blood sugar?  Check your blood sugar levels  using a blood glucose monitor as directed by your doctor. Your doctor will set treatment goals for you. Generally, you should have these blood sugar levels:  Before meals (preprandial): 80-130 mg/dL (4.4-7.2 mmol/L).  After meals (postprandial): below 180 mg/dL (10 mmol/L).  A1c level: less than 7%. Write down the times that you will check your blood sugar levels: Blood sugar checks  Time: _______________ Notes: ___________________________________  Time: _______________ Notes: ___________________________________  Time: _______________ Notes: ___________________________________  Time: _______________ Notes: ___________________________________  Time: _______________ Notes: ___________________________________  Time: _______________ Notes: ___________________________________  What do I need to know about low blood sugar? Low blood sugar is called hypoglycemia. This is when blood sugar is at or below 70 mg/dL (3.9 mmol/L). Symptoms may include:  Feeling: ? Hungry. ? Worried or nervous (anxious). ? Sweaty and clammy. ? Confused. ? Dizzy. ? Sleepy. ? Sick to your stomach (nauseous).  Having: ? A fast heartbeat. ? A headache. ? A change in your vision. ? Tingling or no feeling (numbness) around the mouth, lips, or tongue. ? Jerky movements that you cannot control (seizure).  Having trouble with: ? Moving (coordination). ? Sleeping. ? Passing out (fainting). ? Getting upset easily (irritability). Treating low blood sugar To treat low blood sugar, eat or drink something sugary right away. If you can think clearly and swallow safely, follow the 15:15 rule:  Take 15 grams of a fast-acting carb (carbohydrate). Talk with your doctor about how much you should take.  Some  fast-acting carbs are: ? Sugar tablets (glucose pills). Take 3-4 glucose pills. ? 6-8 pieces of hard candy. ? 4-6 oz (120-150 mL) of fruit juice. ? 4-6 oz (120-150 mL) of regular (not diet) soda. ? 1 Tbsp (15 mL) honey or sugar.  Check your blood sugar 15 minutes after you take the carb.  If your blood sugar is still at or below 70 mg/dL (3.9 mmol/L), take 15 grams of a carb again.  If your blood sugar does not go above 70 mg/dL (3.9 mmol/L) after 3 tries, get help right away.  After your blood sugar goes back to normal, eat a meal or a snack within 1 hour. Treating very low blood sugar If your blood sugar is at or below 54 mg/dL (3 mmol/L), you have very low blood sugar (severe hypoglycemia). This is an emergency. Do not wait to see if the symptoms will go away. Get medical help right away. Call your local emergency services (911 in the [place] Do not drive yourself to the hospital. Questions to ask your health care provider  Do I need to meet with a diabetes educator?  What equipment will I need to care for myself at home?  What diabetes medicines do I need? When should I take them?  How often do I need to check my blood sugar?  What number can I call if I have questions?  When is my next doctor's visit?  Where can I find a support group for people with diabetes? Where to find more information  American Diabetes Association: www.diabetes.org  American Association of Diabetes Educators: www.diabeteseducator.org/patient-resources Contact a doctor if:  Your blood sugar is at or above 240 mg/dL (13.3 mmol/L) for 2 days in a row.  You have been sick or have had a fever for 2 days or more, and you are not getting better.  You have any of these problems for more than 6 hours: ? You cannot eat or drink. ? You feel sick to your stomach (nauseous). ? You  throw up (vomit). ? You have watery poop (diarrhea). Get help right away if:  Your blood sugar is lower than 54 mg/dL (3  mmol/L).  You get confused.  You have trouble: ? Thinking clearly. ? Breathing. Summary  Diabetes (diabetes mellitus) is a long-term (chronic) disease. It occurs when the body does not properly use sugar (glucose) that is released from food after digestion.  Take insulin and diabetes medicines as told.  Check your blood sugar every day, as often as told.  Keep all follow-up visits as told by your doctor. This is important. This information is not intended to replace advice given to you by your health care provider. Make sure you discuss any questions you have with your health care provider. Document Revised: 01/15/2019 Document Reviewed: 07/27/2017 Elsevier Patient Education  [place]. Diabetes Mellitus and Exercise Exercising regularly is important for your overall health, especially when you have diabetes (diabetes mellitus). Exercising is not only about losing weight. It has many other health benefits, such as increasing muscle strength and bone density and reducing body fat and stress. This leads to improved fitness, flexibility, and endurance, all of which result in better overall health. Exercise has additional benefits for people with diabetes, including:  Reducing appetite.  Helping to lower and control blood glucose.  Lowering blood pressure.  Helping to control amounts of fatty substances (lipids) in the blood, such as cholesterol and triglycerides.  Helping the body to respond better to insulin (improving insulin sensitivity).  Reducing how much insulin the body needs.  Decreasing the risk for heart disease by: ? Lowering cholesterol and triglyceride levels. ? Increasing the levels of good cholesterol. ? Lowering blood glucose levels. What is my activity plan? Your health care provider or certified diabetes educator can help you make a plan for the type and frequency of exercise (activity plan) that works for you. Make sure that you:  Do at least 150  minutes of moderate-intensity or vigorous-intensity exercise each week. This could be brisk walking, biking, or water aerobics. ? Do stretching and strength exercises, such as yoga or weightlifting, at least 2 times a week. ? Spread out your activity over at least 3 days of the week.  Get some form of physical activity every day. ? Do not go more than 2 days in a row without some kind of physical activity. ? Avoid being inactive for more than 30 minutes at a time. Take frequent breaks to walk or stretch.  Choose a type of exercise or activity that you enjoy, and set realistic goals.  Start slowly, and gradually increase the intensity of your exercise over time. What do I need to know about managing my diabetes?   Check your blood glucose before and after exercising. ? If your blood glucose is 240 mg/dL (13.3 mmol/L) or higher before you exercise, check your urine for ketones. If you have ketones in your urine, do not exercise until your blood glucose returns to normal. ? If your blood glucose is 100 mg/dL (5.6 mmol/L) or lower, eat a snack containing 15-20 grams of carbohydrate. Check your blood glucose 15 minutes after the snack to make sure that your level is above 100 mg/dL (5.6 mmol/L) before you start your exercise.  Know the symptoms of low blood glucose (hypoglycemia) and how to treat it. Your risk for hypoglycemia increases during and after exercise. Common symptoms of hypoglycemia can include: ? Hunger. ? Anxiety. ? Sweating and feeling clammy. ? Confusion. ? Dizziness or feeling light-headed. ?  Increased heart rate or palpitations. ? Blurry vision. ? Tingling or numbness around the mouth, lips, or tongue. ? Tremors or shakes. ? Irritability.  Keep a rapid-acting carbohydrate snack available before, during, and after exercise to help prevent or treat hypoglycemia.  Avoid injecting insulin into areas of the body that are going to be exercised. For example, avoid injecting  insulin into: ? The arms, when playing tennis. ? The legs, when jogging.  Keep records of your exercise habits. Doing this can help you and your health care provider adjust your diabetes management plan as needed. Write down: ? Food that you eat before and after you exercise. ? Blood glucose levels before and after you exercise. ? The type and amount of exercise you have done. ? When your insulin is expected to peak, if you use insulin. Avoid exercising at times when your insulin is peaking.  When you start a new exercise or activity, work with your health care provider to make sure the activity is safe for you, and to adjust your insulin, medicines, or food intake as needed.  Drink plenty of water while you exercise to prevent dehydration or heat stroke. Drink enough fluid to keep your urine clear or pale yellow. Summary  Exercising regularly is important for your overall health, especially when you have diabetes (diabetes mellitus).  Exercising has many health benefits, such as increasing muscle strength and bone density and reducing body fat and stress.  Your health care provider or certified diabetes educator can help you make a plan for the type and frequency of exercise (activity plan) that works for you.  When you start a new exercise or activity, work with your health care provider to make sure the activity is safe for you, and to adjust your insulin, medicines, or food intake as needed. This information is not intended to replace advice given to you by your health care provider. Make sure you discuss any questions you have with your health care provider. Document Revised: 11/16/2016 Document Reviewed: 10/04/2015 Elsevier Patient Education  2020 [place]. Prediabetes Eating Plan Prediabetes is a condition that causes blood sugar (glucose) levels to be higher than normal. This increases the risk for developing diabetes. In order to prevent diabetes from developing, your health  care provider may recommend a diet and other lifestyle changes to help you:  Control your blood glucose levels.  Improve your cholesterol levels.  Manage your blood pressure. Your health care provider may recommend working with a diet and nutrition specialist (dietitian) to make a meal plan that is best for you. What are tips for following this plan? Lifestyle  Set weight loss goals with the help of your health care team. It is recommended that most people with prediabetes lose 7% of their current body weight.  Exercise for at least 30 minutes at least 5 days a week.  Attend a support group or seek ongoing support from a mental health counselor.  Take over-the-counter and prescription medicines only as told by your health care provider. Reading food labels  Read food labels to check the amount of fat, salt (sodium), and sugar in prepackaged foods. Avoid foods that have: ? Saturated fats. ? Trans fats. ? Added sugars.  Avoid foods that have more than 300 milligrams (mg) of sodium per serving. Limit your daily sodium intake to less than 2,300 mg each day. Shopping  Avoid buying pre-made and processed foods. Cooking  Cook with olive oil. Do not use butter, lard, or ghee.  Bake,  broil, grill, or boil foods. Avoid frying. Meal planning   Work with your dietitian to develop an eating plan that is right for you. This may include: ? Tracking how many calories you take in. Use a food diary, notebook, or mobile application to track what you eat at each meal. ? Using the glycemic index (GI) to plan your meals. The index tells you how quickly a food will raise your blood glucose. Choose low-GI foods. These foods take a longer time to raise blood glucose.  Consider following a Mediterranean diet. This diet includes: ? Several servings each day of fresh fruits and vegetables. ? Eating fish at least twice a week. ? Several servings each day of whole grains, beans, nuts, and  seeds. ? Using olive oil instead of other fats. ? Moderate alcohol consumption. ? Eating small amounts of red meat and whole-fat dairy.  If you have high blood pressure, you may need to limit your sodium intake or follow a diet such as the DASH eating plan. DASH is an eating plan that aims to lower high blood pressure. What foods are recommended? The items listed below may not be a complete list. Talk with your dietitian about what dietary choices are best for you. Grains Whole grains, such as whole-wheat or whole-grain breads, crackers, cereals, and pasta. Unsweetened oatmeal. Bulgur. Barley. Quinoa. Brown rice. Corn or whole-wheat flour tortillas or taco shells. Vegetables Lettuce. Spinach. Peas. Beets. Cauliflower. Cabbage. Broccoli. Carrots. Tomatoes. Squash. Eggplant. Herbs. Peppers. Onions. Cucumbers. Brussels sprouts. Fruits Berries. Bananas. Apples. Oranges. Grapes. Papaya. Mango. Pomegranate. Kiwi. Grapefruit. Cherries. Meats and other protein foods Seafood. Poultry without skin. Lean cuts of pork and beef. Tofu. Eggs. Nuts. Beans. Dairy Low-fat or fat-free dairy products, such as yogurt, cottage cheese, and cheese. Beverages Water. Tea. Coffee. Sugar-free or diet soda. Seltzer water. Lowfat or no-fat milk. Milk alternatives, such as soy or almond milk. Fats and oils Olive oil. Canola oil. Sunflower oil. Grapeseed oil. Avocado. Walnuts. Sweets and desserts Sugar-free or low-fat pudding. Sugar-free or low-fat ice cream and other frozen treats. Seasoning and other foods Herbs. Sodium-free spices. Mustard. Relish. Low-fat, low-sugar ketchup. Low-fat, low-sugar barbecue sauce. Low-fat or fat-free mayonnaise. What foods are not recommended? The items listed below may not be a complete list. Talk with your dietitian about what dietary choices are best for you. Grains Refined white flour and flour products, such as bread, pasta, snack foods, and cereals. Vegetables Canned vegetables.  Frozen vegetables with butter or cream sauce. Fruits Fruits canned with syrup. Meats and other protein foods Fatty cuts of meat. Poultry with skin. Breaded or fried meat. Processed meats. Dairy Full-fat yogurt, cheese, or milk. Beverages Sweetened drinks, such as sweet iced tea and soda. Fats and oils Butter. Lard. Ghee. Sweets and desserts Baked goods, such as cake, cupcakes, pastries, cookies, and cheesecake. Seasoning and other foods Spice mixes with added salt. Ketchup. Barbecue sauce. Mayonnaise. Summary  To prevent diabetes from developing, you may need to make diet and other lifestyle changes to help control blood sugar, improve cholesterol levels, and manage your blood pressure.  Set weight loss goals with the help of your health care team. It is recommended that most people with prediabetes lose 7 percent of their current body weight.  Consider following a Mediterranean diet that includes plenty of fresh fruits and vegetables, whole grains, beans, nuts, seeds, fish, lean meat, low-fat dairy, and healthy oils. This information is not intended to replace advice given to you by your health care provider.  Make sure you discuss any questions you have with your health care provider. Document Revised: 08/16/2018 Document Reviewed: 06/28/2016 Elsevier Patient Education  2020 [employer].

## 2019-10-21 NOTE — Progress Notes (Deleted)
Established patient visit   Patient: Robert Whitaker   DOB: 05-28-1990   29 y.o. Male  MRN: [id number] Visit Date: 10/21/2019  Today's healthcare provider: Jairo Ben, FNP   Chief Complaint  Patient presents with   Fall   Subjective    Fall The accident occurred 12 to 24 hours ago. The fall occurred from a ladder. He fell from a height of 6 to 10 ft. He landed on dirt. There was no blood loss. The pain is present in the back and neck. The pain is at a severity of 7/10. The pain is moderate. The symptoms are aggravated by flexion, pressure on injury, rotation, movement, sitting and extension. Pertinent negatives include no abdominal pain, bowel incontinence, fever, headaches, hearing loss, hematuria, loss of consciousness, nausea, numbness, tingling, visual change or vomiting.   ***  Patient Active Problem List   Diagnosis Date Noted   Bee allergy status 09/05/2019   Screening for STD (sexually transmitted disease) 09/05/2019   Body mass index (BMI) of 45.0-49.9 in adult [facility]) 09/05/2019   Left knee pain 09/05/2019   Morbid obesity (HCC) 09/05/2019   Elevated hemoglobin A1c 09/04/2014   No past medical history on file. Social History   Socioeconomic History   Marital status: Single    Spouse name: Not on file   Number of children: Not on file   Years of education: Not on file   Highest education level: Not on file  Occupational History   Not on file  Tobacco Use   Smoking status: Never Smoker   Smokeless tobacco: Never Used  Substance and Sexual Activity   Alcohol use: Not Currently   Drug use: Never   Sexual activity: Not on file  Other Topics Concern   Not on file  Social History Narrative   Not on file   Social Determinants of Health   Financial Resource Strain:    Difficulty of Paying Living Expenses:   Food Insecurity:    Worried About Running Out of Food in the Last Year:    Barista in the Last Year:     Transportation Needs:    Freight forwarder (Medical):    Lack of Transportation (Non-Medical):   Physical Activity:    Days of Exercise per Week:    Minutes of Exercise per Session:   Stress:    Feeling of Stress :   Social Connections:    Frequency of Communication with Friends and Family:    Frequency of Social Gatherings with Friends and Family:    Attends Religious Services:    Active Member of Clubs or Organizations:    Attends Banker Meetings:    Marital Status:   Intimate Partner Violence:    Fear of Current or Ex-Partner:    Emotionally Abused:    Physically Abused:    Sexually Abused:    Allergies  Allergen Reactions   Bee Venom Swelling       Medications: Outpatient Medications Prior to Visit  Medication Sig   EPINEPHrine 0.3 mg/0.3 mL IJ SOAJ injection Inject 0.3 mLs (0.3 mg total) into the muscle as needed for anaphylaxis.   No facility-administered medications prior to visit.    Review of Systems  Constitutional: Negative for fever.  Eyes: Negative.   Cardiovascular: Negative.   Gastrointestinal: Negative for abdominal pain, bowel incontinence, nausea and vomiting.  Genitourinary: Negative for hematuria.  Musculoskeletal: Positive for arthralgias, back pain, myalgias, neck pain and neck stiffness.  Neurological: Negative for dizziness, tingling, seizures, loss of consciousness, speech difficulty, light-headedness, numbness and headaches.    {Heme   Chem   Endocrine   Serology   Results Review (optional):23779::" "}  Objective    BP (!) 141/82    Pulse 77    Resp 16    SpO2 98%  {Show previous vital signs (optional):23777::" "}  Physical Exam  ***  No results found for any visits on 10/21/19.  Assessment & Plan     ***  Return if symptoms worsen or fail to improve.      {provider attestation***:1}   Marcille Buffy, [place] [phone] (phone) [phone]  (fax)  [place]

## 2019-10-21 NOTE — Progress Notes (Signed)
Established patient visit   Patient: Robert Whitaker   DOB: July 12, 1990   29 y.o. Male  MRN: [id number] Visit Date: 10/21/2019  Today's healthcare provider: Marcille Buffy, FNP   No chief complaint on file.  Subjective    HPI Follow up for Abnormal Lab  The patient was last seen for this 1 months ago. Changes made at last visit include none. On 09/19/19 CMP showed a glucose of 116 and HgbA1C  At 6.5%. Today patient presents back in office to discuss medication options.     Patient  denies any fever, body aches,chills, rash, chest pain, shortness of breath, nausea, vomiting, or diarrhea.  Denies dizziness, lightheadedness, pre syncopal or syncopal episodes.   -----------------------------------------------------------------------------------------   Patient Active Problem List   Diagnosis Date Noted  . Low HDL (under 40) 10/21/2019  . Diabetes mellitus without complication ([place]) 16/96/7893  . Bee allergy status 09/05/2019  . Screening for STD (sexually transmitted disease) 09/05/2019  . Body mass index (BMI) of 45.0-49.9 in adult ([place]) 09/05/2019  . Left knee pain 09/05/2019  . Morbid obesity ([place]) 09/05/2019  . Elevated hemoglobin A1c 09/04/2014   History reviewed. No pertinent past medical history. Social History   Tobacco Use  . Smoking status: Never Smoker  . Smokeless tobacco: Never Used  Substance Use Topics  . Alcohol use: Not Currently  . Drug use: Never   Allergies  Allergen Reactions  . Bee Venom Swelling       Medications: Outpatient Medications Prior to Visit  Medication Sig  . EPINEPHrine 0.3 mg/0.3 mL IJ SOAJ injection Inject 0.3 mLs (0.3 mg total) into the muscle as needed for anaphylaxis.   No facility-administered medications prior to visit.    Review of Systems  Last metabolic panel Lab Results  Component Value Date   GLUCOSE 116 (H) 09/19/2019   NA 137 09/19/2019   K 4.3 09/19/2019   CL 100 09/19/2019   CO2 22  09/19/2019   BUN 17 09/19/2019   CREATININE 1.21 09/19/2019   GFRNONAA 80 09/19/2019   GFRAA 93 09/19/2019   CALCIUM 9.3 09/19/2019   PROT 7.4 09/19/2019   ALBUMIN 4.3 09/19/2019   LABGLOB 3.1 09/19/2019   AGRATIO 1.4 09/19/2019   BILITOT 0.5 09/19/2019   ALKPHOS 58 09/19/2019   AST 28 09/19/2019   ALT 38 09/19/2019   Last lipids Lab Results  Component Value Date   CHOL 116 09/19/2019   HDL 36 (L) 09/19/2019   LDLCALC 65 09/19/2019   TRIG 75 09/19/2019   CHOLHDL 3.2 09/19/2019   Last hemoglobin A1c Lab Results  Component Value Date   HGBA1C 6.5 (H) 09/19/2019      Objective    There were no vitals taken for this visit. BP Readings from Last 3 Encounters:  09/05/19 134/84      Physical Exam Constitutional:      Appearance: Normal appearance. He is obese.  HENT:     Head: Normocephalic and atraumatic.     Right Ear: External ear normal.     Left Ear: External ear normal.  Eyes:     Pupils: Pupils are equal, round, and reactive to light.  Cardiovascular:     Rate and Rhythm: Normal rate and regular rhythm.     Pulses: Normal pulses.     Heart sounds: Normal heart sounds. No murmur heard.  No friction rub. No gallop.   Pulmonary:     Effort: Pulmonary effort is normal. No respiratory distress.  Breath sounds: Normal breath sounds. No stridor. No wheezing, rhonchi or rales.  Chest:     Chest wall: No tenderness.  Musculoskeletal:        General: Normal range of motion.     Cervical back: Normal range of motion and neck supple.  Skin:    General: Skin is warm and dry.     Capillary Refill: Capillary refill takes less than 2 seconds.  Neurological:     General: No focal deficit present.     Mental Status: He is alert.     Motor: No weakness.  Psychiatric:        Mood and Affect: Mood normal.        Behavior: Behavior normal.        Thought Content: Thought content normal.        Judgment: Judgment normal.       No results found for any visits on  10/21/19.  Assessment & Plan     Diabetes mellitus without complication ([place]) - Plan: Ambulatory referral to diabetic education, HgB A1c  Body mass index (BMI) of 45.0-49.9 in adult [facility]), Chronic  Low HDL (under 40)   Orders Placed This Encounter  Procedures  . HgB A1c    Standing Status:   Future    Standing Expiration Date:   10/20/2020  . Ambulatory referral to diabetic education    Referral Priority:   Routine    Referral Type:   Consultation    Referral Reason:   Specialty Services Required    Number of Visits Requested:   1    Meds ordered this encounter  Medications  . blood glucose meter kit and supplies    Sig: Dispense based on patient and insurance preference.Check blood glucose fasting q am.. (FOR ICD-10 E10.9, E11.9).    Dispense:  1 each    Refill:  0    Order Specific Question:   Number of strips    Answer:   44    Order Specific Question:   Number of lancets    Answer:   90    Diet and exercise. Declines metformin at this time. Weight loss recommended. He would like to be seen at nutritionist. Diet education given. An After Visit Summary was printed and given to the patient.    Check blood gluocse in am fasting and keep log.   Return in 3 months (on 01/21/2020), or if symptoms worsen or fail to improve, for at any time for any worsening symptoms, Go to Emergency room/ urgent care if worse.      [place] Dorsie Burich, FNP, have reviewed all documentation for this visit. The documentation on 10/21/19 for the exam, diagnosis, procedures, and orders are all accurate and complete.   Marcille Buffy, [place] [phone] (phone) [phone] (fax)  [place]

## 2020-02-20 ENCOUNTER — Ambulatory Visit: Payer: Self-pay | Admitting: Adult Health

## 2020-02-23 ENCOUNTER — Ambulatory Visit: Payer: Self-pay | Admitting: Adult Health

## 2020-02-27 ENCOUNTER — Ambulatory Visit: Payer: Self-pay | Admitting: Adult Health

## 2020-03-01 ENCOUNTER — Ambulatory Visit (INDEPENDENT_AMBULATORY_CARE_PROVIDER_SITE_OTHER): Payer: Managed Care, Other (non HMO) | Admitting: Adult Health

## 2020-03-01 ENCOUNTER — Other Ambulatory Visit: Payer: Self-pay

## 2020-03-01 ENCOUNTER — Encounter: Payer: Self-pay | Admitting: Adult Health

## 2020-03-01 VITALS — BP 136/74 | HR 74 | Temp 98.1°F | Resp 17 | Wt 346.0 lb

## 2020-03-01 DIAGNOSIS — R7303 Prediabetes: Secondary | ICD-10-CM

## 2020-03-01 DIAGNOSIS — Z6841 Body Mass Index (BMI) 40.0 and over, adult: Secondary | ICD-10-CM

## 2020-03-01 DIAGNOSIS — E119 Type 2 diabetes mellitus without complications: Secondary | ICD-10-CM

## 2020-03-01 DIAGNOSIS — E786 Lipoprotein deficiency: Secondary | ICD-10-CM | POA: Diagnosis not present

## 2020-03-01 LAB — POCT GLYCOSYLATED HEMOGLOBIN (HGB A1C): Hemoglobin A1C: 6.4 % — AB (ref 4.0–5.6)

## 2020-03-01 MED ORDER — ACCU-CHEK GUIDE VI STRP: ORAL_STRIP | 6 refills | Status: AC

## 2020-03-01 MED ORDER — ACCU-CHEK SOFTCLIX LANCETS MISC: 6 refills | Status: AC

## 2020-03-01 NOTE — Progress Notes (Signed)
Established patient visit   Patient: Robert Whitaker   DOB: [birth year]   29 y.o. Male  MRN: [id number] Visit Date: 03/01/2020  Today's healthcare provider: Marcille Buffy, FNP   Chief Complaint  Patient presents with  . Diabetes   Subjective    HPI  Diabetes Mellitus Type II, Follow-up  Lab Results  Component Value Date   HGBA1C 6.4 (A) 03/01/2020   HGBA1C 6.5 (H) 09/19/2019   Wt Readings from Last 3 Encounters:  03/04/20 (!) 346 lb (156.9 kg)  09/05/19 (!) 346 lb (156.9 kg)   Last seen for diabetes 4 months ago.  Management since then includes referral for diabetic education.  Symptoms: No fatigue No foot ulcerations  No appetite changes No nausea  No paresthesia of the feet  No polydipsia  No polyuria No visual disturbances   No vomiting     Home blood sugar records: fasting range: 104  Episodes of hypoglycemia? No    Current insulin regiment: none Most Recent Eye Exam: 08/2018 patient reports Current exercise: walking Current diet habits: well balanced   Patient  denies any fever, body aches,chills, rash, chest pain, shortness of breath, nausea, vomiting, or diarrhea.  Denies dizziness, lightheadedness, pre syncopal or syncopal episodes.   Pertinent Labs: Lab Results  Component Value Date   CHOL 116 09/19/2019   HDL 36 (L) 09/19/2019   LDLCALC 65 09/19/2019   TRIG 75 09/19/2019   CHOLHDL 3.2 09/19/2019   Lab Results  Component Value Date   NA 137 09/19/2019   K 4.3 09/19/2019   CREATININE 1.21 09/19/2019   GFRNONAA 80 09/19/2019   GFRAA 93 09/19/2019   GLUCOSE 116 (H) 09/19/2019     ---------------------------------------------------------------------------------------------------  Patient Active Problem List   Diagnosis Date Noted  . Low HDL (under 40) 10/21/2019  . Diabetes mellitus without complication ([place]) 99/37/1696  . Bee allergy status 09/05/2019  . Screening for STD (sexually transmitted disease) 09/05/2019  . Body  mass index (BMI) of 45.0-49.9 in adult ([place]) 09/05/2019  . Left knee pain 09/05/2019  . Morbid obesity ([place]) 09/05/2019  . Elevated hemoglobin A1c 09/04/2014   History reviewed. No pertinent past medical history. Allergies  Allergen Reactions  . Bee Venom Swelling, Anaphylaxis, Hives, Itching, Rash and Shortness Of Breath       Medications: Outpatient Medications Prior to Visit  Medication Sig  . blood glucose meter kit and supplies Dispense based on patient and insurance preference.Check blood glucose fasting q am.. (FOR ICD-10 E10.9, E11.9).  Marland Kitchen EPINEPHrine 0.3 mg/0.3 mL IJ SOAJ injection Inject 0.3 mLs (0.3 mg total) into the muscle as needed for anaphylaxis.  . [DISCONTINUED] ACCU-CHEK GUIDE test strip   . [DISCONTINUED] Accu-Chek Softclix Lancets lancets SMARTSIG:Topical   No facility-administered medications prior to visit.    Review of Systems    Objective    BP 136/74   Pulse 74   Temp 98.1 F (36.7 C) (Oral)   Resp 17   Wt (!) 346 lb (156.9 kg)   SpO2 99%   BMI 49.65 kg/m    Physical Exam Vitals and nursing note reviewed.  Constitutional:      General: He is not in acute distress.    Appearance: Normal appearance. He is well-developed. He is obese. He is not ill-appearing, toxic-appearing or diaphoretic.     Comments: Patient is alert and oriented and responsive to questions Engages in eye contact with provider. Speaks in full sentences without any pauses without any shortness of  breath or distress.    HENT:     Head: Normocephalic and atraumatic.     Right Ear: Hearing, tympanic membrane, ear canal and external ear normal.     Left Ear: Hearing, tympanic membrane, ear canal and external ear normal.     Nose: Nose normal.     Mouth/Throat:     Pharynx: Uvula midline. No oropharyngeal exudate.  Eyes:     General: Lids are normal. No scleral icterus.       Right eye: No discharge.        Left eye: No discharge.     Conjunctiva/sclera: Conjunctivae  normal.     Pupils: Pupils are equal, round, and reactive to light.  Neck:     Thyroid: No thyromegaly.     Vascular: Normal carotid pulses. No carotid bruit, hepatojugular reflux or JVD.     Trachea: Trachea and phonation normal. No tracheal tenderness or tracheal deviation.     Meningeal: Brudzinski's sign absent.  Cardiovascular:     Rate and Rhythm: Normal rate and regular rhythm.     Pulses: Normal pulses.     Heart sounds: Normal heart sounds, S1 normal and S2 normal. Heart sounds not distant. No murmur heard.  No friction rub. No gallop.   Pulmonary:     Effort: Pulmonary effort is normal. No accessory muscle usage or respiratory distress.     Breath sounds: Normal breath sounds. No stridor. No wheezing or rales.  Chest:     Chest wall: No tenderness.  Abdominal:     General: Bowel sounds are normal. There is no distension.     Palpations: Abdomen is soft. There is no mass.     Tenderness: There is no abdominal tenderness. There is no guarding or rebound.     Hernia: No hernia is present.  Musculoskeletal:        General: No tenderness or deformity. Normal range of motion.     Cervical back: Full passive range of motion without pain, normal range of motion and neck supple.     Comments: Patient moves on and off of exam table and in room without difficulty. Gait is normal in hall and in room. Patient is oriented to person place time and situation. Patient answers questions appropriately and engages in conversation.   Lymphadenopathy:     Head:     Right side of head: No submental, submandibular, tonsillar, preauricular, posterior auricular or occipital adenopathy.     Left side of head: No submental, submandibular, tonsillar, preauricular, posterior auricular or occipital adenopathy.     Cervical: No cervical adenopathy.  Skin:    General: Skin is warm and dry.     Capillary Refill: Capillary refill takes less than 2 seconds.     Coloration: Skin is not pale.     Findings:  No erythema or rash.     Nails: There is no clubbing.  Neurological:     Mental Status: He is alert and oriented to person, place, and time.     GCS: GCS eye subscore is 4. GCS verbal subscore is 5. GCS motor subscore is 6.     Cranial Nerves: No cranial nerve deficit.     Sensory: No sensory deficit.     Motor: No abnormal muscle tone.     Coordination: Coordination normal.     Gait: Gait normal.     Deep Tendon Reflexes: Reflexes are normal and symmetric. Reflexes normal.  Psychiatric:  Mood and Affect: Mood normal.        Speech: Speech normal.        Behavior: Behavior normal.        Thought Content: Thought content normal.        Judgment: Judgment normal.      Results for orders placed or performed in visit on 03/01/20  POCT glycosylated hemoglobin (Hb A1C)  Result Value Ref Range   Hemoglobin A1C 6.4 (A) 4.0 - 5.6 %   HbA1c POC (<> result, manual entry)     HbA1c, POC (prediabetic range)     HbA1c, POC (controlled diabetic range)      Assessment & Plan     Prediabetes - Plan: POCT glycosylated hemoglobin (Hb A1C), Accu-Chek Softclix Lancets lancets, ACCU-CHEK GUIDE test strip, Comprehensive Metabolic Panel (CMET), CBC with Differential/Platelet, Lipid Panel w/o Chol/HDL Ratio  Low HDL (under 40) - Plan: Lipid Panel w/o Chol/HDL Ratio  Body mass index (BMI) of 45.0-49.9 in adult [facility]), Chronic  Diabetes mellitus without complication ([place]), Chronic   Meds ordered this encounter  Medications  . Accu-Chek Softclix Lancets lancets    Sig: Use as instructed check fasting blood glucose up to 4 times daily.    Dispense:  200 each    Refill:  6  . ACCU-CHEK GUIDE test strip    Sig: Check blood glucose as directed up to 4 times a day.    Dispense:  200 each    Refill:  6  no other refills needed. Will send to pharmacy as request if he needs and we will fill.   Will continue current regimen with Metformin, recheck Hemoglobin A1C in 3 months.  Aggressive  lifestyle and dietary changers/ weight loss advised.    Orders Placed This Encounter  Procedures  . Comprehensive Metabolic Panel (CMET)  . CBC with Differential/Platelet  . Lipid Panel w/o Chol/HDL Ratio  . POCT glycosylated hemoglobin (Hb A1C)   Return in about 6 months (around 08/30/2020), or if symptoms worsen or fail to improve, for at any time for any worsening symptoms, Go to Emergency room/ urgent care if worse.      Marcille Buffy, [place] [phone] (phone) [phone] (fax)  [place]

## 2020-03-01 NOTE — Progress Notes (Signed)
Patient doing stress reduction, aggressive lifestyle and dietary changes. Given mediterranean diet and information on pre diabetes. Has meter at home monitoring fasting blood glucose and keeping records. Working on weight loss and increased activity.  Recheck in 6 months, return sooner if needed.

## 2020-03-01 NOTE — Patient Instructions (Addendum)
Diabetes Mellitus and Nutrition, Adult When you have diabetes (diabetes mellitus), it is very important to have healthy eating habits because your blood sugar (glucose) levels are greatly affected by what you eat and drink. Eating healthy foods in the appropriate amounts, at about the same times every day, can help you:  Control your blood glucose.  Lower your risk of heart disease.  Improve your blood pressure.  Reach or maintain a healthy weight. Every person with diabetes is different, and each person has different needs for a meal plan. Your health care provider may recommend that you work with a diet and nutrition specialist (dietitian) to make a meal plan that is best for you. Your meal plan may vary depending on factors such as:  The calories you need.  The medicines you take.  Your weight.  Your blood glucose, blood pressure, and cholesterol levels.  Your activity level.  Other health conditions you have, such as heart or kidney disease. How do carbohydrates affect me? Carbohydrates, also called carbs, affect your blood glucose level more than any other type of food. Eating carbs naturally raises the amount of glucose in your blood. Carb counting is a method for keeping track of how many carbs you eat. Counting carbs is important to keep your blood glucose at a healthy level, especially if you use insulin or take certain oral diabetes medicines. It is important to know how many carbs you can safely have in each meal. This is different for every person. Your dietitian can help you calculate how many carbs you should have at each meal and for each snack. Foods that contain carbs include:  Bread, cereal, rice, pasta, and crackers.  Potatoes and corn.  Peas, beans, and lentils.  Milk and yogurt.  Fruit and juice.  Desserts, such as cakes, cookies, ice cream, and candy. How does alcohol affect me? Alcohol can cause a sudden decrease in blood glucose (hypoglycemia),  especially if you use insulin or take certain oral diabetes medicines. Hypoglycemia can be a life-threatening condition. Symptoms of hypoglycemia (sleepiness, dizziness, and confusion) are similar to symptoms of having too much alcohol. If your health care provider says that alcohol is safe for you, follow these guidelines:  Limit alcohol intake to no more than 1 drink per day for nonpregnant women and 2 drinks per day for men. One drink equals 12 oz of beer, 5 oz of wine, or 1 oz of hard liquor.  Do not drink on an empty stomach.  Keep yourself hydrated with water, diet soda, or unsweetened iced tea.  Keep in mind that regular soda, juice, and other mixers may contain a lot of sugar and must be counted as carbs. What are tips for following this plan?  Reading food labels  Start by checking the serving size on the "Nutrition Facts" label of packaged foods and drinks. The amount of calories, carbs, fats, and other nutrients listed on the label is based on one serving of the item. Many items contain more than one serving per package.  Check the total grams (g) of carbs in one serving. You can calculate the number of servings of carbs in one serving by dividing the total carbs by 15. For example, if a food has 30 g of total carbs, it would be equal to 2 servings of carbs.  Check the number of grams (g) of saturated and trans fats in one serving. Choose foods that have low or no amount of these fats.  Check the number of   milligrams (mg) of salt (sodium) in one serving. Most people should limit total sodium intake to less than 2,300 mg per day.  Always check the nutrition information of foods labeled as "low-fat" or "nonfat". These foods may be higher in added sugar or refined carbs and should be avoided.  Talk to your dietitian to identify your daily goals for nutrients listed on the label. Shopping  Avoid buying canned, premade, or processed foods. These foods tend to be high in fat, sodium,  and added sugar.  Shop around the outside edge of the grocery store. This includes fresh fruits and vegetables, bulk grains, fresh meats, and fresh dairy. Cooking  Use low-heat cooking methods, such as baking, instead of high-heat cooking methods like deep frying.  Cook using healthy oils, such as olive, canola, or sunflower oil.  Avoid cooking with butter, cream, or high-fat meats. Meal planning  Eat meals and snacks regularly, preferably at the same times every day. Avoid going long periods of time without eating.  Eat foods high in fiber, such as fresh fruits, vegetables, beans, and whole grains. Talk to your dietitian about how many servings of carbs you can eat at each meal.  Eat 4-6 ounces (oz) of lean protein each day, such as lean meat, chicken, fish, eggs, or tofu. One oz of lean protein is equal to: ? 1 oz of meat, chicken, or fish. ? 1 egg. ?  cup of tofu.  Eat some foods each day that contain healthy fats, such as avocado, nuts, seeds, and fish. Lifestyle  Check your blood glucose regularly.  Exercise regularly as told by your health care provider. This may include: ? 150 minutes of moderate-intensity or vigorous-intensity exercise each week. This could be brisk walking, biking, or water aerobics. ? Stretching and doing strength exercises, such as yoga or weightlifting, at least 2 times a week.  Take medicines as told by your health care provider.  Do not use any products that contain nicotine or tobacco, such as cigarettes and e-cigarettes. If you need help quitting, ask your health care provider.  Work with a Veterinary surgeon or diabetes educator to identify strategies to manage stress and any emotional and social challenges. Questions to ask a health care provider  Do I need to meet with a diabetes educator?  Do I need to meet with a dietitian?  What number can I call if I have questions?  When are the best times to check my blood glucose? Where to find more  information:  American Diabetes Association: diabetes.org  Academy of Nutrition and Dietetics: www.eatright.[employer] of Diabetes and Digestive and Kidney Diseases (NIH): CarFlippers.tn Summary  A healthy meal plan will help you control your blood glucose and maintain a healthy lifestyle.  Working with a diet and nutrition specialist (dietitian) can help you make a meal plan that is best for you.  Keep in mind that carbohydrates (carbs) and alcohol have immediate effects on your blood glucose levels. It is important to count carbs and to use alcohol carefully. This information is not intended to replace advice given to you by your health care provider. Make sure you discuss any questions you have with your health care provider. Document Revised: 04/06/2017 Document Reviewed: 05/29/2016 Elsevier Patient Education  2020 [employer]. Mediterranean Diet A Mediterranean diet refers to food and lifestyle choices that are based on the traditions of countries located on the [employer]. This way of eating has been shown to help prevent certain conditions and improve outcomes  for people who have chronic diseases, like kidney disease and heart disease. What are tips for following this plan? Lifestyle  Cook and eat meals together with your family, when possible.  Drink enough fluid to keep your urine clear or pale yellow.  Be physically active every day. This includes: ? Aerobic exercise like running or swimming. ? Leisure activities like gardening, walking, or housework.  Get 7-8 hours of sleep each night.  If recommended by your health care provider, drink red wine in moderation. This means 1 glass a day for nonpregnant women and 2 glasses a day for men. A glass of wine equals 5 oz (150 mL). Reading food labels   Check the serving size of packaged foods. For foods such as rice and pasta, the serving size refers to the amount of cooked product, not dry.  Check the  total fat in packaged foods. Avoid foods that have saturated fat or trans fats.  Check the ingredients list for added sugars, such as corn syrup. Shopping  At the grocery store, buy most of your food from the areas near the walls of the store. This includes: ? Fresh fruits and vegetables (produce). ? Grains, beans, nuts, and seeds. Some of these may be available in unpackaged forms or large amounts (in bulk). ? Fresh seafood. ? Poultry and eggs. ? Low-fat dairy products.  Buy whole ingredients instead of prepackaged foods.  Buy fresh fruits and vegetables in-season from local farmers markets.  Buy frozen fruits and vegetables in resealable bags.  If you do not have access to quality fresh seafood, buy precooked frozen shrimp or canned fish, such as tuna, salmon, or sardines.  Buy small amounts of raw or cooked vegetables, salads, or olives from the deli or salad bar at your store.  Stock your pantry so you always have certain foods on hand, such as olive oil, canned tuna, canned tomatoes, rice, pasta, and beans. Cooking  Cook foods with extra-virgin olive oil instead of using butter or other vegetable oils.  Have meat as a side dish, and have vegetables or grains as your main dish. This means having meat in small portions or adding small amounts of meat to foods like pasta or stew.  Use beans or vegetables instead of meat in common dishes like chili or lasagna.  Experiment with different cooking methods. Try roasting or broiling vegetables instead of steaming or sauteing them.  Add frozen vegetables to soups, stews, pasta, or rice.  Add nuts or seeds for added healthy fat at each meal. You can add these to yogurt, salads, or vegetable dishes.  Marinate fish or vegetables using olive oil, lemon juice, garlic, and fresh herbs. Meal planning   Plan to eat 1 vegetarian meal one day each week. Try to work up to 2 vegetarian meals, if possible.  Eat seafood 2 or more times a  week.  Have healthy snacks readily available, such as: ? Vegetable sticks with hummus. ? [place] yogurt. ? Fruit and nut trail mix.  Eat balanced meals throughout the week. This includes: ? Fruit: 2-3 servings a day ? Vegetables: 4-5 servings a day ? Low-fat dairy: 2 servings a day ? Fish, poultry, or lean meat: 1 serving a day ? Beans and legumes: 2 or more servings a week ? Nuts and seeds: 1-2 servings a day ? Whole grains: 6-8 servings a day ? Extra-virgin olive oil: 3-4 servings a day  Limit red meat and sweets to only a few servings a month What are  my food choices?  Mediterranean diet ? Recommended  Grains: Whole-grain pasta. Brown rice. Bulgar wheat. Polenta. Couscous. Whole-wheat bread. Orpah Cobb.  Vegetables: Artichokes. Beets. Broccoli. Cabbage. Carrots. Eggplant. Green beans. Chard. Kale. Spinach. Onions. Leeks. Peas. Squash. Tomatoes. Peppers. Radishes.  Fruits: Apples. Apricots. Avocado. Berries. Bananas. Cherries. Dates. Figs. Grapes. Lemons. Melon. Oranges. Peaches. Plums. Pomegranate.  Meats and other protein foods: Beans. Almonds. Sunflower seeds. Pine nuts. Peanuts. Cod. Salmon. Scallops. Shrimp. Tuna. Tilapia. Clams. Oysters. Eggs.  Dairy: Low-fat milk. Cheese. Greek yogurt.  Beverages: Water. Red wine. Herbal tea.  Fats and oils: Extra virgin olive oil. Avocado oil. Grape seed oil.  Sweets and desserts: [place] yogurt with honey. Baked apples. Poached pears. Trail mix.  Seasoning and other foods: Basil. Cilantro. Coriander. Cumin. Mint. Parsley. Sage. Rosemary. Tarragon. Garlic. Oregano. Thyme. Pepper. Balsalmic vinegar. Tahini. Hummus. Tomato sauce. Olives. Mushrooms. ? Limit these  Grains: Prepackaged pasta or rice dishes. Prepackaged cereal with added sugar.  Vegetables: Deep fried potatoes (french fries).  Fruits: Fruit canned in syrup.  Meats and other protein foods: Beef. Pork. Lamb. Poultry with skin. Hot dogs. Tomasa Blase.  Dairy: Ice cream.  Sour cream. Whole milk.  Beverages: Juice. Sugar-sweetened soft drinks. Beer. Liquor and spirits.  Fats and oils: Butter. Canola oil. Vegetable oil. Beef fat (tallow). Lard.  Sweets and desserts: Cookies. Cakes. Pies. Candy.  Seasoning and other foods: Mayonnaise. Premade sauces and marinades. The items listed may not be a complete list. Talk with your dietitian about what dietary choices are right for you. Summary  The Mediterranean diet includes both food and lifestyle choices.  Eat a variety of fresh fruits and vegetables, beans, nuts, seeds, and whole grains.  Limit the amount of red meat and sweets that you eat.  Talk with your health care provider about whether it is safe for you to drink red wine in moderation. This means 1 glass a day for nonpregnant women and 2 glasses a day for men. A glass of wine equals 5 oz (150 mL). This information is not intended to replace advice given to you by your health care provider. Make sure you discuss any questions you have with your health care provider. Document Revised: 12/23/2015 Document Reviewed: 12/16/2015 Elsevier Patient Education  2020 Elsevier Inc.   Calorie Counting for [employer] Calories are units of energy. Your body needs a certain amount of calories from food to keep you going throughout the day. When you eat more calories than your body needs, your body stores the extra calories as fat. When you eat fewer calories than your body needs, your body burns fat to get the energy it needs. Calorie counting means keeping track of how many calories you eat and drink each day. Calorie counting can be helpful if you need to lose weight. If you make sure to eat fewer calories than your body needs, you should lose weight. Ask your health care provider what a healthy weight is for you. For calorie counting to work, you will need to eat the right number of calories in a day in order to lose a healthy amount of weight per week. A dietitian can  help you determine how many calories you need in a day and will give you suggestions on how to reach your calorie goal.  A healthy amount of weight to lose per week is usually 1-2 lb (0.5-0.9 kg). This usually means that your daily calorie intake should be reduced by 500-750 calories.  Eating 1,200 - 1,500 calories per day  can help most women lose weight.  Eating 1,500 - 1,800 calories per day can help most men lose weight. What is my plan? My goal is to have __________ calories per day. If I have this many calories per day, I should lose around __________ pounds per week. What do I need to know about calorie counting? In order to meet your daily calorie goal, you will need to:  Find out how many calories are in each food you would like to eat. Try to do this before you eat.  Decide how much of the food you plan to eat.  Write down what you ate and how many calories it had. Doing this is called keeping a food log. To successfully lose weight, it is important to balance calorie counting with a healthy lifestyle that includes regular activity. Aim for 150 minutes of moderate exercise (such as walking) or 75 minutes of vigorous exercise (such as running) each week. Where do I find calorie information?  The number of calories in a food can be found on a Nutrition Facts label. If a food does not have a Nutrition Facts label, try to look up the calories online or ask your dietitian for help. Remember that calories are listed per serving. If you choose to have more than one serving of a food, you will have to multiply the calories per serving by the amount of servings you plan to eat. For example, the label on a package of bread might say that a serving size is 1 slice and that there are 90 calories in a serving. If you eat 1 slice, you will have eaten 90 calories. If you eat 2 slices, you will have eaten 180 calories. How do I keep a food log? Immediately after each meal, record the following  information in your food log:  What you ate. Don't forget to include toppings, sauces, and other extras on the food.  How much you ate. This can be measured in cups, ounces, or number of items.  How many calories each food and drink had.  The total number of calories in the meal. Keep your food log near you, such as in a small notebook in your pocket, or use a mobile app or website. Some programs will calculate calories for you and show you how many calories you have left for the day to meet your goal. What are some calorie counting tips?   Use your calories on foods and drinks that will fill you up and not leave you hungry: ? Some examples of foods that fill you up are nuts and nut butters, vegetables, lean proteins, and high-fiber foods like whole grains. High-fiber foods are foods with more than 5 g fiber per serving. ? Drinks such as sodas, specialty coffee drinks, alcohol, and juices have a lot of calories, yet do not fill you up.  Eat nutritious foods and avoid empty calories. Empty calories are calories you get from foods or beverages that do not have many vitamins or protein, such as candy, sweets, and soda. It is better to have a nutritious high-calorie food (such as an avocado) than a food with few nutrients (such as a bag of chips).  Know how many calories are in the foods you eat most often. This will help you calculate calorie counts faster.  Pay attention to calories in drinks. Low-calorie drinks include water and unsweetened drinks.  Pay attention to nutrition labels for "low fat" or "fat free" foods. These foods sometimes have  the same amount of calories or more calories than the full fat versions. They also often have added sugar, starch, or salt, to make up for flavor that was removed with the fat.  Find a way of tracking calories that works for you. Get creative. Try different apps or programs if writing down calories does not work for you. What are some portion control  tips?  Know how many calories are in a serving. This will help you know how many servings of a certain food you can have.  Use a measuring cup to measure serving sizes. You could also try weighing out portions on a kitchen scale. With time, you will be able to estimate serving sizes for some foods.  Take some time to put servings of different foods on your favorite plates, bowls, and cups so you know what a serving looks like.  Try not to eat straight from a bag or box. Doing this can lead to overeating. Put the amount you would like to eat in a cup or on a plate to make sure you are eating the right portion.  Use smaller plates, glasses, and bowls to prevent overeating.  Try not to multitask (for example, watch TV or use your computer) while eating. If it is time to eat, sit down at a table and enjoy your food. This will help you to know when you are full. It will also help you to be aware of what you are eating and how much you are eating. What are tips for following this plan? Reading food labels  Check the calorie count compared to the serving size. The serving size may be smaller than what you are used to eating.  Check the source of the calories. Make sure the food you are eating is high in vitamins and protein and low in saturated and trans fats. Shopping  Read nutrition labels while you shop. This will help you make healthy decisions before you decide to purchase your food.  Make a grocery list and stick to it. Cooking  Try to cook your favorite foods in a healthier way. For example, try baking instead of frying.  Use low-fat dairy products. Meal planning  Use more fruits and vegetables. Half of your plate should be fruits and vegetables.  Include lean proteins like poultry and fish. How do I count calories when eating out?  Ask for smaller portion sizes.  Consider sharing an entree and sides instead of getting your own entree.  If you get your own entree, eat only  half. Ask for a box at the beginning of your meal and put the rest of your entree in it so you are not tempted to eat it.  If calories are listed on the menu, choose the lower calorie options.  Choose dishes that include vegetables, fruits, whole grains, low-fat dairy products, and lean protein.  Choose items that are boiled, broiled, grilled, or steamed. Stay away from items that are buttered, battered, fried, or served with cream sauce. Items labeled "crispy" are usually fried, unless stated otherwise.  Choose water, low-fat milk, unsweetened iced tea, or other drinks without added sugar. If you want an alcoholic beverage, choose a lower calorie option such as a glass of wine or light beer.  Ask for dressings, sauces, and syrups on the side. These are usually high in calories, so you should limit the amount you eat.  If you want a salad, choose a garden salad and ask for grilled meats. Avoid extra  toppings like bacon, cheese, or fried items. Ask for the dressing on the side, or ask for olive oil and vinegar or lemon to use as dressing.  Estimate how many servings of a food you are given. For example, a serving of cooked rice is  cup or about the size of half a baseball. Knowing serving sizes will help you be aware of how much food you are eating at restaurants. The list below tells you how big or small some common portion sizes are based on everyday objects: ? 1 oz--4 stacked dice. ? 3 oz--1 deck of cards. ? 1 tsp--1 die. ? 1 Tbsp-- a ping-pong ball. ? 2 Tbsp--1 ping-pong ball. ?  cup-- baseball. ? 1 cup--1 baseball. Summary  Calorie counting means keeping track of how many calories you eat and drink each day. If you eat fewer calories than your body needs, you should lose weight.  A healthy amount of weight to lose per week is usually 1-2 lb (0.5-0.9 kg). This usually means reducing your daily calorie intake by 500-750 calories.  The number of calories in a food can be found on a  Nutrition Facts label. If a food does not have a Nutrition Facts label, try to look up the calories online or ask your dietitian for help.  Use your calories on foods and drinks that will fill you up, and not on foods and drinks that will leave you hungry.  Use smaller plates, glasses, and bowls to prevent overeating. This information is not intended to replace advice given to you by your health care provider. Make sure you discuss any questions you have with your health care provider. Document Revised: 01/11/2018 Document Reviewed: 03/24/2016 Elsevier Patient Education  2020 Elsevier Inc.   Fat and Cholesterol Restricted Eating Plan Getting too much fat and cholesterol in your diet may cause health problems. Choosing the right foods helps keep your fat and cholesterol at normal levels. This can keep you from getting certain diseases. Your doctor may recommend an eating plan that includes:  Total fat: ______% or less of total calories a day.  Saturated fat: ______% or less of total calories a day.  Cholesterol: less than _________mg a day.  Fiber: ______g a day. What are tips for following this plan? Meal planning  At meals, divide your plate into four equal parts: ? Fill one-half of your plate with vegetables and green salads. ? Fill one-fourth of your plate with whole grains. ? Fill one-fourth of your plate with low-fat (lean) protein foods.  Eat fish that is high in omega-3 fats at least two times a week. This includes mackerel, tuna, sardines, and salmon.  Eat foods that are high in fiber, such as whole grains, beans, apples, broccoli, carrots, peas, and barley. General tips   Work with your doctor to lose weight if you need to.  Avoid: ? Foods with added sugar. ? Fried foods. ? Foods with partially hydrogenated oils.  Limit alcohol intake to no more than 1 drink a day for nonpregnant women and 2 drinks a day for men. One drink equals 12 oz of beer, 5 oz of wine, or 1  oz of hard liquor. Reading food labels  Check food labels for: ? Trans fats. ? Partially hydrogenated oils. ? Saturated fat (g) in each serving. ? Cholesterol (mg) in each serving. ? Fiber (g) in each serving.  Choose foods with healthy fats, such as: ? Monounsaturated fats. ? Polyunsaturated fats. ? Omega-3 fats.  Choose grain products  that have whole grains. Look for the word "whole" as the first word in the ingredient list. Cooking  Cook foods using low-fat methods. These include baking, boiling, grilling, and broiling.  Eat more home-cooked foods. Eat at restaurants and buffets less often.  Avoid cooking using saturated fats, such as butter, cream, palm oil, palm kernel oil, and coconut oil. Recommended foods  Fruits  All fresh, canned (in natural juice), or frozen fruits. Vegetables  Fresh or frozen vegetables (raw, steamed, roasted, or grilled). Green salads. Grains  Whole grains, such as whole wheat or whole grain breads, crackers, cereals, and pasta. Unsweetened oatmeal, bulgur, barley, quinoa, or brown rice. Corn or whole wheat flour tortillas. Meats and other protein foods  Ground beef (85% or leaner), grass-fed beef, or beef trimmed of fat. Skinless chicken or [place]. Ground chicken or [place]. Pork trimmed of fat. All fish and seafood. Egg whites. Dried beans, peas, or lentils. Unsalted nuts or seeds. Unsalted canned beans. Nut butters without added sugar or oil. Dairy  Low-fat or nonfat dairy products, such as skim or 1% milk, 2% or reduced-fat cheeses, low-fat and fat-free ricotta or cottage cheese, or plain low-fat and nonfat yogurt. Fats and oils  Tub margarine without trans fats. Light or reduced-fat mayonnaise and salad dressings. Avocado. Olive, canola, sesame, or safflower oils. The items listed above may not be a complete list of foods and beverages you can eat. Contact a dietitian for more information. Foods to avoid Fruits  Canned fruit in heavy  syrup. Fruit in cream or butter sauce. Fried fruit. Vegetables  Vegetables cooked in cheese, cream, or butter sauce. Fried vegetables. Grains  White bread. White pasta. White rice. Cornbread. Bagels, pastries, and croissants. Crackers and snack foods that contain trans fat and hydrogenated oils. Meats and other protein foods  Fatty cuts of meat. Ribs, chicken wings, bacon, sausage, bologna, salami, chitterlings, fatback, hot dogs, bratwurst, and packaged lunch meats. Liver and organ meats. Whole eggs and egg yolks. Chicken and [place] with skin. Fried meat. Dairy  Whole or 2% milk, cream, half-and-half, and cream cheese. Whole milk cheeses. Whole-fat or sweetened yogurt. Full-fat cheeses. Nondairy creamers and whipped toppings. Processed cheese, cheese spreads, and cheese curds. Beverages  Alcohol. Sugar-sweetened drinks such as sodas, lemonade, and fruit drinks. Fats and oils  Butter, stick margarine, lard, shortening, ghee, or bacon fat. Coconut, palm kernel, and palm oils. Sweets and desserts  Corn syrup, sugars, honey, and molasses. Candy. Jam and jelly. Syrup. Sweetened cereals. Cookies, pies, cakes, donuts, muffins, and ice cream. The items listed above may not be a complete list of foods and beverages you should avoid. Contact a dietitian for more information. Summary  Choosing the right foods helps keep your fat and cholesterol at normal levels. This can keep you from getting certain diseases.  At meals, fill one-half of your plate with vegetables and green salads.  Eat high-fiber foods, like whole grains, beans, apples, carrots, peas, and barley.  Limit added sugar, saturated fats, alcohol, and fried foods. This information is not intended to replace advice given to you by your health care provider. Make sure you discuss any questions you have with your health care provider. Document Revised: 12/26/2017 Document Reviewed: 01/09/2017 Elsevier Patient Education  2020 [employer]

## 2020-03-04 ENCOUNTER — Encounter: Payer: Self-pay | Admitting: Adult Health

## 2020-08-25 ENCOUNTER — Ambulatory Visit: Payer: Self-pay | Admitting: Adult Health

## 2020-08-30 ENCOUNTER — Ambulatory Visit: Payer: Self-pay | Admitting: Adult Health

## 2022-02-06 DIAGNOSIS — M7989 Other specified soft tissue disorders: Secondary | ICD-10-CM | POA: Insufficient documentation

## 2022-02-06 DIAGNOSIS — M2142 Flat foot [pes planus] (acquired), left foot: Secondary | ICD-10-CM | POA: Diagnosis not present

## 2022-02-06 DIAGNOSIS — M79672 Pain in left foot: Secondary | ICD-10-CM | POA: Diagnosis not present

## 2022-02-06 DIAGNOSIS — M2022 Hallux rigidus, left foot: Secondary | ICD-10-CM | POA: Diagnosis not present

## 2022-02-07 DIAGNOSIS — M2022 Hallux rigidus, left foot: Secondary | ICD-10-CM | POA: Insufficient documentation

## 2022-02-08 ENCOUNTER — Encounter: Payer: Self-pay | Admitting: Physician Assistant

## 2022-02-08 ENCOUNTER — Ambulatory Visit (INDEPENDENT_AMBULATORY_CARE_PROVIDER_SITE_OTHER): Payer: BC Managed Care – PPO | Admitting: Physician Assistant

## 2022-02-08 VITALS — BP 139/86 | HR 98 | Temp 98.4°F | Resp 16 | Ht 71.0 in | Wt 340.0 lb

## 2022-02-08 DIAGNOSIS — R2242 Localized swelling, mass and lump, left lower limb: Secondary | ICD-10-CM

## 2022-02-08 DIAGNOSIS — E119 Type 2 diabetes mellitus without complications: Secondary | ICD-10-CM | POA: Diagnosis not present

## 2022-02-08 DIAGNOSIS — M79672 Pain in left foot: Secondary | ICD-10-CM | POA: Diagnosis not present

## 2022-02-08 NOTE — Progress Notes (Signed)
I,Robert Whitaker,acting as a Education administrator for [employer], PA-C.,have documented all relevant documentation on the behalf of Robert Speak, PA-C,as directed by  [employer], PA-C while in the presence of [employer], PA-C.   Established patient visit   Patient: Robert Whitaker   DOB: 05-24-1990   31 y.o. Male  MRN: [id number] Visit Date: 02/08/2022  Today's healthcare provider: [name], PA-C   Chief Complaint  Patient presents with   Toe Pain   Subjective    HPI  Toe pain: Patient complains of pain and swelling of the great toe on his left foot. Symptoms started 10 days ago. He denies any injury. He was seen by and urgent care in [place], and then days later seen by [facility]  on 02/02/2022. He states he was prescribed prednisone and another medication (he does not remember the name).   Medications: Outpatient Medications Prior to Visit  Medication Sig   ACCU-CHEK GUIDE test strip Check blood glucose as directed up to 4 times a day.   Accu-Chek Softclix Lancets lancets Use as instructed check fasting blood glucose up to 4 times daily.   blood glucose meter kit and supplies Dispense based on patient and insurance preference.Check blood glucose fasting q am.. (FOR ICD-10 E10.9, E11.9).   EPINEPHrine 0.3 mg/0.3 mL IJ SOAJ injection Inject 0.3 mLs (0.3 mg total) into the muscle as needed for anaphylaxis.   No facility-administered medications prior to visit.    Review of Systems  Constitutional:  Negative for appetite change, chills and fever.  Respiratory:  Negative for chest tightness, shortness of breath and wheezing.   Cardiovascular:  Negative for chest pain and palpitations.  Gastrointestinal:  Negative for abdominal pain, nausea and vomiting.  Musculoskeletal:  Positive for arthralgias and joint swelling.       Objective    BP 139/86 (BP Location: Right Arm, Patient Position: Sitting, Cuff Size: Large)   Pulse 98   Temp 98.4 F (36.9 C) (Oral)    Resp 16   Ht 5' 11"  (4.098 m)   Wt (!) 340 lb (154.2 kg)   SpO2 100% Comment: room air  BMI 47.42 kg/m    Physical Exam Vitals reviewed.  Constitutional:      Appearance: He is obese.  HENT:     Head: Normocephalic and atraumatic.  Musculoskeletal:        General: Swelling (first MTP joing of the left foot), tenderness (I MTP joint of the left foot) and deformity present.  Neurological:     Mental Status: He is oriented to person, place, and time. Mental status is at baseline.     Sensory: No sensory deficit.     Motor: No weakness.  Psychiatric:        Behavior: Behavior normal.        Thought Content: Thought content normal.        Judgment: Judgment normal.       No results found for any visits on 02/08/22.  Assessment & Plan     1. Left foot pain Could be due to gout or trauma - Uric acid - Comprehensive Metabolic Panel (CMET) - Sed Rate (ESR) - C-reactive protein - CBC with Differential/Platelet  2. Localized swelling of left foot Pt was asked to sign release form for his records from [facility] - Uric acid - Comprehensive Metabolic Panel (CMET) - Sed Rate (ESR) - C-reactive protein - CBC with Differential/Platelet  3. DMII Chronic. Stable Lifestyle modifications advised  4. Morbid obesity ([place]) Weight loss of 5% of pt's current weight via healthy diet and daily exercise encouraged.  Lost a few pounds since the last visit    Fu in 2 weeks   The patient was advised to call back or seek an in-person evaluation if the symptoms worsen or if the condition fails to improve as anticipated.  I discussed the assessment and treatment plan with the patient. The patient was provided an opportunity to ask questions and all were answered. The patient agreed with the plan and demonstrated an understanding of the instructions.  The entirety of the information documented in the History of Present Illness, Review of Systems and Physical Exam were personally  obtained by me. Portions of this information were initially documented by the CMA and reviewed by me for thoroughness and accuracy.   Portions of this note were created using dictation software and may contain typographical errors.     Robert Speak, PA-C  [facility] [phone] (phone) [phone] (fax)  [place]

## 2022-02-09 ENCOUNTER — Other Ambulatory Visit: Payer: Self-pay | Admitting: Physician Assistant

## 2022-02-09 DIAGNOSIS — L0390 Cellulitis, unspecified: Secondary | ICD-10-CM

## 2022-02-09 DIAGNOSIS — E119 Type 2 diabetes mellitus without complications: Secondary | ICD-10-CM

## 2022-02-09 LAB — HEMOGLOBIN A1C
Est. average glucose Bld gHb Est-mCnc: 148 mg/dL
Hgb A1c MFr Bld: 6.8 % — ABNORMAL HIGH (ref 4.8–5.6)

## 2022-02-09 LAB — CBC WITH DIFFERENTIAL/PLATELET
Basophils Absolute: 0 10*3/uL (ref 0.0–0.2)
Basos: 0 %
EOS (ABSOLUTE): 0 10*3/uL (ref 0.0–0.4)
Eos: 1 %
Hematocrit: 41.4 % (ref 37.5–51.0)
Hemoglobin: 13.8 g/dL (ref 13.0–17.7)
Immature Grans (Abs): 0 10*3/uL (ref 0.0–0.1)
Immature Granulocytes: 1 %
Lymphocytes Absolute: 1.5 10*3/uL (ref 0.7–3.1)
Lymphs: 24 %
MCH: 27.8 pg (ref 26.6–33.0)
MCHC: 33.3 g/dL (ref 31.5–35.7)
MCV: 83 fL (ref 79–97)
Monocytes Absolute: 0.5 10*3/uL (ref 0.1–0.9)
Monocytes: 8 %
Neutrophils Absolute: 4.2 10*3/uL (ref 1.4–7.0)
Neutrophils: 66 %
Platelets: 323 10*3/uL (ref 150–450)
RBC: 4.97 x10E6/uL (ref 4.14–5.80)
RDW: 14.1 % (ref 11.6–15.4)
WBC: 6.4 10*3/uL (ref 3.4–10.8)

## 2022-02-09 LAB — COMPREHENSIVE METABOLIC PANEL
ALT: 37 IU/L (ref 0–44)
AST: 21 IU/L (ref 0–40)
Albumin/Globulin Ratio: 1.6 (ref 1.2–2.2)
Albumin: 4.5 g/dL (ref 4.1–5.1)
Alkaline Phosphatase: 63 IU/L (ref 44–121)
BUN/Creatinine Ratio: 17 (ref 9–20)
BUN: 20 mg/dL (ref 6–20)
Bilirubin Total: 0.6 mg/dL (ref 0.0–1.2)
CO2: 24 mmol/L (ref 20–29)
Calcium: 9.6 mg/dL (ref 8.7–10.2)
Chloride: 99 mmol/L (ref 96–106)
Creatinine, Ser: 1.18 mg/dL (ref 0.76–1.27)
Globulin, Total: 2.9 g/dL (ref 1.5–4.5)
Glucose: 124 mg/dL — ABNORMAL HIGH (ref 70–99)
Potassium: 4.5 mmol/L (ref 3.5–5.2)
Sodium: 137 mmol/L (ref 134–144)
Total Protein: 7.4 g/dL (ref 6.0–8.5)
eGFR: 84 mL/min/{1.73_m2} (ref >59)

## 2022-02-09 LAB — URIC ACID: Uric Acid: 8 mg/dL (ref 3.8–8.4)

## 2022-02-09 LAB — SEDIMENTATION RATE: Sed Rate: 21 mm/hr — ABNORMAL HIGH (ref 0–15)

## 2022-02-09 LAB — C-REACTIVE PROTEIN: CRP: 17 mg/L — ABNORMAL HIGH (ref 0–10)

## 2022-02-09 MED ORDER — CEPHALEXIN 500 MG PO CAPS: 500.0000 mg | ORAL_CAPSULE | Freq: Three times a day (TID) | ORAL | 0 refills | Status: DC

## 2022-02-09 MED ORDER — METFORMIN HCL 500 MG PO TABS: 500.0000 mg | ORAL_TABLET | Freq: Two times a day (BID) | ORAL | 3 refills | Status: DC

## 2022-02-09 NOTE — Progress Notes (Addendum)
Patient was advised. Patient states he will probably not start metformin. He wants to try diet and exercise first.

## 2022-02-09 NOTE — Progress Notes (Signed)
Hello [facility] [employer] ,   Your labwork results all are back: Your a1c increased to 6.8. You need to start on Metformin - to treat your DMII Your inflammatory markers increased. I will send an abx to your pharmacy Your uric acid WNL   Any questions please reach out to the office or message me on MyChart!  Best, [name], PA-C

## 2022-02-09 NOTE — Progress Notes (Signed)
Please, carefully, read benefits and side effects of each rx-ed medications. Thank you

## 2022-02-13 DIAGNOSIS — R262 Difficulty in walking, not elsewhere classified: Secondary | ICD-10-CM | POA: Diagnosis not present

## 2022-02-13 DIAGNOSIS — M79672 Pain in left foot: Secondary | ICD-10-CM | POA: Diagnosis not present

## 2022-02-13 DIAGNOSIS — M10072 Idiopathic gout, left ankle and foot: Secondary | ICD-10-CM | POA: Diagnosis not present

## 2022-02-13 DIAGNOSIS — M2022 Hallux rigidus, left foot: Secondary | ICD-10-CM | POA: Diagnosis not present

## 2022-02-21 NOTE — Progress Notes (Deleted)
Argentina Ponder Montravious Weigelt,acting as a Education administrator for [employer], PA-C.,have documented all relevant documentation on the behalf of [name], PA-C,as directed by  [employer], PA-C while in the presence of [employer], PA-C.    Established patient visit   Patient: Robert Whitaker   DOB: Mar 01, 1991   31 y.o. Male  MRN: [id number] Visit Date: 02/22/2022  Today's healthcare provider: [name], PA-C   No chief complaint on file.  Subjective    HPI  Patient is a 31 year old male who presents for follow up of left foot pain.  He was last seen on 02/08/22.  At that time he had labs that revealed some elevated inflammatory markers.  Prescription was sent to pharmacy for Cephalexin.  Patient had normal Uric Acid.    Patient also had increase in his A1C from last year with the level going from 6.4 to 6.8.  Patient was started on Metformin.  Medications: Outpatient Medications Prior to Visit  Medication Sig   ACCU-CHEK GUIDE test strip Check blood glucose as directed up to 4 times a day.   Accu-Chek Softclix Lancets lancets Use as instructed check fasting blood glucose up to 4 times daily.   blood glucose meter kit and supplies Dispense based on patient and insurance preference.Check blood glucose fasting q am.. (FOR ICD-10 E10.9, E11.9).   cephALEXin (KEFLEX) 500 MG capsule Take 1 capsule (500 mg total) by mouth 3 (three) times daily.   EPINEPHrine 0.3 mg/0.3 mL IJ SOAJ injection Inject 0.3 mLs (0.3 mg total) into the muscle as needed for anaphylaxis.   metFORMIN (GLUCOPHAGE) 500 MG tablet Take 1 tablet (500 mg total) by mouth 2 (two) times daily with a meal.   No facility-administered medications prior to visit.    Review of Systems  {Labs  Heme  Chem  Endocrine  Serology  Results Review (optional):23779}   Objective    There were no vitals taken for this visit. {Show previous vital signs (optional):23777}  Physical Exam  ***  No results found for any visits on  02/22/22.  Assessment & Plan     ***  No follow-ups on file.      {provider attestation***:1}   [name], Hershal Coria  [facility] [phone] (phone) [phone] (fax)  [place]

## 2022-02-22 ENCOUNTER — Ambulatory Visit: Payer: BC Managed Care – PPO | Admitting: Physician Assistant

## 2022-02-24 ENCOUNTER — Ambulatory Visit (INDEPENDENT_AMBULATORY_CARE_PROVIDER_SITE_OTHER): Payer: BC Managed Care – PPO | Admitting: Physician Assistant

## 2022-02-24 ENCOUNTER — Encounter: Payer: Self-pay | Admitting: Physician Assistant

## 2022-02-24 DIAGNOSIS — E119 Type 2 diabetes mellitus without complications: Secondary | ICD-10-CM | POA: Diagnosis not present

## 2022-02-24 DIAGNOSIS — R2242 Localized swelling, mass and lump, left lower limb: Secondary | ICD-10-CM | POA: Diagnosis not present

## 2022-02-24 DIAGNOSIS — M79672 Pain in left foot: Secondary | ICD-10-CM

## 2022-02-24 NOTE — Progress Notes (Signed)
     I,Kenslei Hearty,acting as a Education administrator for [employer], PA-C.,have documented all relevant documentation on the behalf of [name], PA-C,as directed by  [employer], PA-C while in the presence of [employer], PA-C.   Established patient visit   Patient: Robert Whitaker   DOB: January 25, 1991   31 y.o. Male  MRN: [id number] Visit Date: 02/24/2022  Today's healthcare provider: [name], PA-C   Chief Complaint  Patient presents with  . Follow-up   Subjective    HPI  Follow up for Localized swelling of left foot:  The patient was last seen for this 2 weeks ago. Changes made at last visit include; patient was given an antibiotic.  -----------------------------------------------------------------------------------------  Patient's foot has improved. He states he saw podiatry and was given Voltaen tablets, which has help tremendously.  Scheduled with [facility] on 31 Octoober  Medications: Outpatient Medications Prior to Visit  Medication Sig  . ACCU-CHEK GUIDE test strip Check blood glucose as directed up to 4 times a day.  . Accu-Chek Softclix Lancets lancets Use as instructed check fasting blood glucose up to 4 times daily.  . blood glucose meter kit and supplies Dispense based on patient and insurance preference.Check blood glucose fasting q am.. (FOR ICD-10 E10.9, E11.9).  Marland Kitchen diclofenac (VOLTAREN) 75 MG EC tablet Take 75 mg by mouth 2 (two) times daily.  . diclofenac Sodium (VOLTAREN) 1 % GEL Apply topically 4 (four) times daily.  Marland Kitchen EPINEPHrine 0.3 mg/0.3 mL IJ SOAJ injection Inject 0.3 mLs (0.3 mg total) into the muscle as needed for anaphylaxis.  . metFORMIN (GLUCOPHAGE) 500 MG tablet Take 1 tablet (500 mg total) by mouth 2 (two) times daily with a meal.  . [DISCONTINUED] cephALEXin (KEFLEX) 500 MG capsule Take 1 capsule (500 mg total) by mouth 3 (three) times daily. (Patient not taking: Reported on 02/24/2022)   No facility-administered medications prior to visit.     Review of Systems  Constitutional:  Negative for appetite change, chills and fever.  Respiratory:  Negative for chest tightness, shortness of breath and wheezing.   Cardiovascular:  Negative for chest pain and palpitations.  Gastrointestinal:  Negative for abdominal pain, nausea and vomiting.    {Labs  Heme  Chem  Endocrine  Serology  Results Review (optional):23779}   Objective    BP 136/75 (BP Location: Left Arm, Patient Position: Sitting, Cuff Size: Large)   Pulse 83   Resp 16   Wt (!) 342 lb (155.1 kg)   SpO2 99%   BMI 47.70 kg/m  {Show previous vital signs (optional):23777}  Physical Exam  ***  No results found for any visits on 02/24/22.  Assessment & Plan     ***  No follow-ups on file.      {provider attestation***:1}   [name], Hershal Coria  [facility] [phone] (phone) [phone] (fax)  [place]

## 2022-02-27 DIAGNOSIS — E119 Type 2 diabetes mellitus without complications: Secondary | ICD-10-CM | POA: Diagnosis not present

## 2022-03-01 LAB — MICROALBUMIN / CREATININE URINE RATIO
Creatinine, Urine: 266.5 mg/dL
Microalb/Creat Ratio: 6 mg/g creat (ref 0–29)
Microalbumin, Urine: 15.1 ug/mL

## 2022-03-01 LAB — LIPID PANEL
Chol/HDL Ratio: 3.2 ratio (ref 0.0–5.0)
Cholesterol, Total: 119 mg/dL (ref 100–199)
HDL: 37 mg/dL — ABNORMAL LOW (ref >39)
LDL Chol Calc (NIH): 67 mg/dL (ref 0–99)
Triglycerides: 75 mg/dL (ref 0–149)
VLDL Cholesterol Cal: 15 mg/dL (ref 5–40)

## 2022-03-01 LAB — TSH: TSH: 0.789 u[IU]/mL (ref 0.450–4.500)

## 2022-03-01 NOTE — Progress Notes (Signed)
Please, let pt know that all his labs negative or wnl except decreased good cholesterol. You might try OTC niacin and weight loss via healthy diet and daily exercise advised.

## 2022-03-02 ENCOUNTER — Encounter: Payer: Self-pay | Admitting: *Deleted

## 2022-03-06 DIAGNOSIS — R262 Difficulty in walking, not elsewhere classified: Secondary | ICD-10-CM | POA: Insufficient documentation

## 2022-03-06 DIAGNOSIS — M109 Gout, unspecified: Secondary | ICD-10-CM | POA: Insufficient documentation

## 2022-03-06 DIAGNOSIS — B353 Tinea pedis: Secondary | ICD-10-CM | POA: Diagnosis not present

## 2022-03-06 DIAGNOSIS — B351 Tinea unguium: Secondary | ICD-10-CM | POA: Diagnosis not present

## 2022-03-06 DIAGNOSIS — M79672 Pain in left foot: Secondary | ICD-10-CM | POA: Diagnosis not present

## 2022-03-06 DIAGNOSIS — M2022 Hallux rigidus, left foot: Secondary | ICD-10-CM | POA: Diagnosis not present

## 2022-03-07 ENCOUNTER — Encounter: Payer: Self-pay | Admitting: *Deleted

## 2022-03-07 ENCOUNTER — Encounter: Payer: BC Managed Care – PPO | Attending: Physician Assistant | Admitting: *Deleted

## 2022-03-07 VITALS — BP 144/96 | Ht 71.0 in | Wt 342.9 lb

## 2022-03-07 DIAGNOSIS — E119 Type 2 diabetes mellitus without complications: Secondary | ICD-10-CM | POA: Diagnosis not present

## 2022-03-07 DIAGNOSIS — Z713 Dietary counseling and surveillance: Secondary | ICD-10-CM | POA: Diagnosis not present

## 2022-03-07 NOTE — Patient Instructions (Signed)
Check blood sugars before breakfast or 2 hrs after one meal every day Bring blood sugar records to the next class  Exercise: Continue exercise program  for    45  minutes   4  days a week  Eat 3 meals day,   1-2  snacks a day Space meals 4-6 hours apart Limit fried foods (2-3 x week) Avoid sugar sweetened drinks (tea)  Return for classes on:

## 2022-03-07 NOTE — Progress Notes (Signed)
Diabetes Self-Management Education  Visit Type: First/Initial  Appt. Start Time: 0840 Appt. End Time: 0940  03/07/2022  [name], identified by name and date of birth, is a 31 y.o. male with a diagnosis of Diabetes: Type 2.   ASSESSMENT  Blood pressure (!) 144/96, height 5\' 11"  (1.803 m), weight (!) 342 lb 14.4 oz (155.5 kg). Body mass index is 47.82 kg/m.   Diabetes Self-Management Education - 03/07/22 1104       Visit Information   Visit Type First/Initial      Initial Visit   Diabetes Type Type 2    Date Diagnosed 2-3 weeks    Are you currently following a meal plan? Yes    What type of meal plan do you follow? "putting more color on my plate"    Are you taking your medications as prescribed? Yes      Health Coping   How would you rate your overall health? Good      Psychosocial Assessment   Patient Belief/Attitude about Diabetes Other (comment)   "nervous"   What is the hardest part about your diabetes right now, causing you the most concern, or is the most worrisome to you about your diabetes?   Making healty food and beverage choices    Self-care barriers None    Self-management support Doctor's office    Patient Concerns Nutrition/Meal planning;Weight Control;Healthy Lifestyle    Special Needs None    Preferred Learning Style Visual;Other (comment)   talking/discussion   Learning Readiness Change in progress    How often do you need to have someone help you when you read instructions, pamphlets, or other written materials from your doctor or pharmacy? 1 - Never    What is the last grade level you completed in school? Bachelor's      Pre-Education Assessment   Patient understands the diabetes disease and treatment process. Needs Instruction    Patient understands incorporating nutritional management into lifestyle. Needs Instruction    Patient undertands incorporating physical activity into lifestyle. Comprehends key points    Patient understands using  medications safely. Needs Instruction    Patient understands monitoring blood glucose, interpreting and using results Needs Review    Patient understands prevention, detection, and treatment of acute complications. Needs Instruction    Patient understands prevention, detection, and treatment of chronic complications. Needs Instruction    Patient understands how to develop strategies to address psychosocial issues. Needs Instruction    Patient understands how to develop strategies to promote health/change behavior. Needs Instruction      Complications   Last HgB A1C per patient/outside source 6.8 %   02/08/2022   How often do you check your blood sugar? 1-2 times/day    Fasting Blood glucose range (mg/dL) 70-129   He reports FBG's average 120's mg/dL   Have you had a dilated eye exam in the past 12 months? Yes    Have you had a dental exam in the past 12 months? Yes    Are you checking your feet? No      Dietary Intake   Breakfast eggs, sausage, bacon, toast, oatmeal, grits    Snack (morning) sometimes morning snack - nuts, almonds    Lunch chick-fil-a sandwich and fries; chicken in crock pot; left overs    Dinner beef, chicken, salmon, pork; potatoes, beans, occasional peas and corn, broccol, brussels sprouts, asparagus, zucchini, green beans, carrots, mushrooms, rice    Snack (evening) chicps, popcorn    Beverage(s) water, Propel,  sweet tea 1-2 x week      Activity / Exercise   Activity / Exercise Type Moderate (swimming / aerobic walking);Light (walking / raking leaves)   walking, biking, weight lifting   How many days per week do you exercise? 4    How many minutes per day do you exercise? 45    Total minutes per week of exercise 180      Patient Education   Previous Diabetes Education No    Disease Pathophysiology Definition of diabetes, type 1 and 2, and the diagnosis of diabetes;Factors that contribute to the development of diabetes;Explored patient's options for treatment of  their diabetes    Healthy Eating Role of diet in the treatment of diabetes and the relationship between the three main macronutrients and blood glucose level;Food label reading, portion sizes and measuring food.;Reviewed blood glucose goals for pre and post meals and how to evaluate the patients' food intake on their blood glucose level.    Being Active Role of exercise on diabetes management, blood pressure control and cardiac health.    Medications Reviewed patients medication for diabetes, action, purpose, timing of dose and side effects.    Monitoring Purpose and frequency of SMBG.;Taught/discussed recording of test results and interpretation of SMBG.;Identified appropriate SMBG and/or A1C goals.    Chronic complications Relationship between chronic complications and blood glucose control    Diabetes Stress and Support Identified and addressed patients feelings and concerns about diabetes      Individualized Goals (developed by patient)   Reducing Risk Other (comment)   lose weight, lead a healthier lifestyle     Outcomes   Expected Outcomes Demonstrated interest in learning. Expect positive outcomes    Future DMSE 4-6 wks         Individualized Plan for Diabetes Self-Management Training:   Learning Objective:  Patient will have a greater understanding of diabetes self-management. Patient education plan is to attend individual and/or group sessions per assessed needs and concerns.   Plan:   Patient Instructions  Check blood sugars before breakfast or 2 hrs after one meal every day Bring blood sugar records to the next class  Exercise: Continue exercise program  for    45  minutes   4  days a week  Eat 3 meals day,   1-2  snacks a day Space meals 4-6 hours apart Limit fried foods (2-3 x week) Avoid sugar sweetened drinks (tea)  Return for classes on:     Expected Outcomes:  Demonstrated interest in learning. Expect positive outcomes  Education material provided:  General  Meal Planning Guidelines Simple Meal Plan Healthy Snack Choices (ADA)  If problems or questions, patient to contact team via:   Johny Drilling, RN, [place] [phone]  Future DSME appointment: 4-6 wks April 10, 2022 for Diabetes Class 1

## 2022-04-06 NOTE — Progress Notes (Unsigned)
I,Sha'taria Tyson,acting as a Education administrator for [employer], PA-C.,have documented all relevant documentation on the behalf of [name], PA-C,as directed by  [employer], PA-C while in the presence of [employer], PA-C.   Complete physical exam   Patient: Robert Whitaker   DOB: 02-27-1991   31 y.o. Male  MRN: [id number] Visit Date: 04/07/2022  Today's healthcare provider: [name], PA-C  CC: CPE  Subjective    Robert Whitaker is a 30 y.o. male who presents today for a complete physical exam.   HPI He reports consuming a general diet.  The patient reports working out 3-4 times a week for at least 45 minutes doing anything from cycling to weight lifting  He generally feels well. He reports sleeping fairly well. He does not have additional problems to discuss today.   Past Medical History:  Diagnosis Date   Diabetes mellitus without complication ([place])    Past Surgical History:  Procedure Laterality Date   KNEE ARTHROSCOPY Right    Social History   Socioeconomic History   Marital status: Single    Spouse name: Not on file   Number of children: Not on file   Years of education: Not on file   Highest education level: Not on file  Occupational History   Not on file  Tobacco Use   Smoking status: Never   Smokeless tobacco: Never  Substance and Sexual Activity   Alcohol use: Yes    Comment: socially   Drug use: Never   Sexual activity: Not on file  Other Topics Concern   Not on file  Social History Narrative   Not on file   Social Determinants of Health   Financial Resource Strain: Not on file  Food Insecurity: Not on file  Transportation Needs: Not on file  Physical Activity: Not on file  Stress: Not on file  Social Connections: Not on file  Intimate Partner Violence: Not on file   No family status information on file.   No family history on file. Allergies  Allergen Reactions   Bee Venom Swelling, Anaphylaxis, Hives, Itching, Rash and Shortness Of  Breath    Patient Care Team: Elberta Leatherwood as PCP - General (Physician Assistant)   Medications: Outpatient Medications Prior to Visit  Medication Sig   ACCU-CHEK GUIDE test strip Check blood glucose as directed up to 4 times a day.   Accu-Chek Softclix Lancets lancets Use as instructed check fasting blood glucose up to 4 times daily.   blood glucose meter kit and supplies Dispense based on patient and insurance preference.Check blood glucose fasting q am.. (FOR ICD-10 E10.9, E11.9).   ciclopirox (PENLAC) 8 % solution Apply topically.   diclofenac (VOLTAREN) 75 MG EC tablet Take 75 mg by mouth 2 (two) times daily.   diclofenac Sodium (VOLTAREN) 1 % GEL Apply topically 4 (four) times daily.   econazole nitrate 1 % cream Apply topically.   EPINEPHrine 0.3 mg/0.3 mL IJ SOAJ injection Inject 0.3 mLs (0.3 mg total) into the muscle as needed for anaphylaxis.   metFORMIN (GLUCOPHAGE) 500 MG tablet Take 1 tablet (500 mg total) by mouth 2 (two) times daily with a meal.   VITAMIN D PO Take 1 capsule by mouth daily.   No facility-administered medications prior to visit.    Review of Systems  All other systems reviewed and are negative. Except see HPI    Objective    There were no vitals taken for this visit.  Physical Exam Vitals reviewed.  Constitutional:      General: He is not in acute distress.    Appearance: Normal appearance. He is well-developed. He is not diaphoretic.  HENT:     Head: Normocephalic and atraumatic.     Right Ear: Tympanic membrane, ear canal and external ear normal.     Left Ear: Tympanic membrane, ear canal and external ear normal.     Nose: Nose normal.     Mouth/Throat:     Mouth: Mucous membranes are moist.     Pharynx: Oropharynx is clear. No oropharyngeal exudate or posterior oropharyngeal erythema.  Eyes:     General: No scleral icterus.       Right eye: No discharge.        Left eye: No discharge.     Conjunctiva/sclera: Conjunctivae  normal.     Pupils: Pupils are equal, round, and reactive to light.  Neck:     Thyroid: No thyromegaly.  Cardiovascular:     Rate and Rhythm: Normal rate and regular rhythm.     Pulses: Normal pulses.     Heart sounds: Normal heart sounds. No murmur heard. Pulmonary:     Effort: Pulmonary effort is normal. No respiratory distress.     Breath sounds: Normal breath sounds. No wheezing or rales.  Abdominal:     General: Abdomen is flat. Bowel sounds are normal. There is no distension.     Palpations: Abdomen is soft.     Tenderness: There is no abdominal tenderness. There is no right CVA tenderness, left CVA tenderness, guarding or rebound.  Musculoskeletal:        General: No swelling, tenderness, deformity or signs of injury. Normal range of motion.     Cervical back: Normal range of motion and neck supple.     Right lower leg: No edema.     Left lower leg: No edema.  Lymphadenopathy:     Cervical: No cervical adenopathy.  Skin:    General: Skin is warm and dry.     Capillary Refill: Capillary refill takes less than 2 seconds.     Coloration: Skin is not jaundiced or pale.     Findings: No bruising, erythema or rash.  Neurological:     Mental Status: He is alert and oriented to person, place, and time. Mental status is at baseline.     Cranial Nerves: No cranial nerve deficit.     Sensory: No sensory deficit.     Motor: No weakness.     Coordination: Coordination normal.     Gait: Gait normal.     Deep Tendon Reflexes: Reflexes normal.  Psychiatric:        Mood and Affect: Mood normal.        Behavior: Behavior normal.        Thought Content: Thought content normal.        Judgment: Judgment normal.      Last depression screening scores    03/07/2022    8:48 AM 02/08/2022    8:44 AM  PHQ 2/9 Scores  PHQ - 2 Score 0 1  PHQ- 9 Score  2   Last fall risk screening    03/07/2022    8:48 AM  Fall Risk   Falls in the past year? 0   Last Audit-C alcohol use  screening    09/05/2019    3:30 PM  Alcohol Use Disorder Test (AUDIT)  1. How often do you have a drink containing alcohol? 1  2. How many drinks containing alcohol do you have on a typical day when you are drinking? 0  3. How often do you have six or more drinks on one occasion? 0  AUDIT-C Score 1   A score of 3 or more in women, and 4 or more in men indicates increased risk for alcohol abuse, EXCEPT if all of the points are from question 1   No results found for any visits on 04/07/22.  Assessment & Plan    Routine Health Maintenance and Physical Exam  Exercise Activities and Dietary recommendations  Goals    Weight loss of 5% of pt's current weight via healthy diet and daily exercise encouraged.      Immunization History  Administered Date(s) Administered   Dtap, Unspecified 06/12/1990, 08/28/1990, 06/04/1991, 08/29/1994   HIB (PRP-OMP) 06/12/1990, 08/28/1990, 06/04/1991   Hepatitis B 02/13/2002, 03/19/2002, 08/13/2002   IPV 06/12/1990, 06/04/1991, 08/29/1994   MMR 06/04/1991, 08/29/1994   Tdap 08/27/2008, 09/03/2014    Health Maintenance  Topic Date Due   COVID-19 Vaccine (1) Never done   OPHTHALMOLOGY EXAM  11/11/2020   INFLUENZA VACCINE  08/06/2022 (Originally 12/06/2021)   HEMOGLOBIN A1C  08/10/2022   Diabetic kidney evaluation - GFR measurement  02/09/2023   FOOT EXAM  02/25/2023   Diabetic kidney evaluation - Urine ACR  02/28/2023   DTaP/Tdap/Td (7 - Td or Tdap) 09/02/2024   Hepatitis C Screening  Completed   HIV Screening  Completed   HPV VACCINES  Aged Out    Discussed health benefits of physical activity, and encouraged him to engage in regular exercise appropriate for his age and condition.  1. Annual physical exam  UTD on dental/eye?  Things to do to keep yourself healthy  - Exercise at least 30-45 minutes a day, 3-4 days a week.  - Eat a low-fat diet with lots of fruits and vegetables, up to 7-9 servings per day.  - Seatbelts can save your life.  Wear them always.  - Smoke detectors on every level of your home, check batteries every year.  - Eye Doctor - have an eye exam every 1-2 years  - Safe sex - if you may be exposed to STDs, use a condom.  - Alcohol -  If you drink, do it moderately, less than 2 drinks per day.  - [place]. Choose someone to speak for you if you are not able.  - Depression is common in our stressful world.If you're feeling down or losing interest in things you normally enjoy, please come in for a visit.  - Violence - If anyone is threatening or hurting you, please call immediately.  2. Morbid obesity Chronic problem Continue with nutrition services Weight loss of 5% of pt's current weight via healthy low carb low cholesterol  diet and daily exercise encouraged.  Will FU  The patient was advised to call back or seek an in-person evaluation if the symptoms worsen or if the condition fails to improve as anticipated.  I discussed the assessment and treatment plan with the patient. The patient was provided an opportunity to ask questions and all were answered. The patient agreed with the plan and demonstrated an understanding of the instructions.  The entirety of the information documented in the History of Present Illness, Review of Systems and Physical Exam were personally obtained by me. Portions of this information were initially documented by the CMA and reviewed by me for thoroughness and accuracy.   [name], PAC, MMS  [employer] [phone] (phone) [phone] (fax)

## 2022-04-07 ENCOUNTER — Encounter: Payer: Self-pay | Admitting: Physician Assistant

## 2022-04-07 ENCOUNTER — Ambulatory Visit (INDEPENDENT_AMBULATORY_CARE_PROVIDER_SITE_OTHER): Payer: BC Managed Care – PPO | Admitting: Physician Assistant

## 2022-04-07 VITALS — BP 136/84 | HR 99 | Ht 70.5 in | Wt 341.5 lb

## 2022-04-07 DIAGNOSIS — Z0000 Encounter for general adult medical examination without abnormal findings: Secondary | ICD-10-CM | POA: Diagnosis not present

## 2022-04-10 ENCOUNTER — Encounter: Payer: Self-pay | Admitting: *Deleted

## 2022-04-10 ENCOUNTER — Encounter: Payer: BC Managed Care – PPO | Attending: Physician Assistant | Admitting: *Deleted

## 2022-04-10 VITALS — Wt 342.7 lb

## 2022-04-10 DIAGNOSIS — Z713 Dietary counseling and surveillance: Secondary | ICD-10-CM | POA: Diagnosis not present

## 2022-04-10 DIAGNOSIS — Z6841 Body Mass Index (BMI) 40.0 and over, adult: Secondary | ICD-10-CM | POA: Diagnosis not present

## 2022-04-10 DIAGNOSIS — E119 Type 2 diabetes mellitus without complications: Secondary | ICD-10-CM | POA: Diagnosis not present

## 2022-04-10 NOTE — Progress Notes (Signed)
Appt. Start Time: 1730 Appt. End Time: 2000  Class 1 Diabetes Overview - define DM; state own type of DM; identify functions of pancreas and insulin; define insulin deficiency vs insulin resistance  Psychosocial - identify DM as a source of stress; state the effects of stress on BG control  Nutritional Management - describe effects of food on blood glucose; identify sources of carbohydrate, protein and fat; verbalize the importance of balance meals in controlling blood glucose  Exercise - describe the effects of exercise on blood glucose and importance of regular exercise in controlling diabetes; state a plan for personal exercise; verbalize contraindications for exercise  Self-Monitoring - state importance of SMBG; use SMBG results to effectively manage diabetes; identify importance of regular HbA1C testing and goals for results  Acute Complications - recognize hyperglycemia and hypoglycemia with causes and effects; identify blood glucose results as high, low or in control; list steps in treating and preventing high and low blood glucose  Chronic Complications/Foot, Skin, Eye Dental Care - identify possible long-term complications of diabetes (retinopathy, neuropathy, nephropathy, cardiovascular disease, infections); state importance of daily self-foot exams; describe how to examine feet and what to look for; explain appropriate eye and dental care  Lifestyle Changes/Goals & Health/Community Resources - state benefits of making appropriate lifestyle changes; identify habits that need to change (meals, tobacco, alcohol); identify strategies to reduce risk factors for personal health  Pregnancy/Sexual Health - define gestational diabetes; state importance of good blood glucose control and birth control prior to pregnancy; state importance of good blood glucose control in preventing sexual problems (impotence, vaginal dryness, infections, loss of desire); state relationship of blood glucose control  and pregnancy outcome; describe risk of maternal and fetal complications  Teaching Materials Used: Class 1 Slides/Notebook Diabetes Booklet ID Card  Medic Alert/Medic ID Forms Sleep Evaluation Exercise Handout Planning a Balanced Meal Goals for Class 1  

## 2022-04-17 ENCOUNTER — Encounter: Payer: BC Managed Care – PPO | Admitting: *Deleted

## 2022-04-17 ENCOUNTER — Encounter: Payer: Self-pay | Admitting: *Deleted

## 2022-04-17 VITALS — Wt 344.7 lb

## 2022-04-17 DIAGNOSIS — E119 Type 2 diabetes mellitus without complications: Secondary | ICD-10-CM | POA: Diagnosis not present

## 2022-04-17 DIAGNOSIS — Z713 Dietary counseling and surveillance: Secondary | ICD-10-CM | POA: Diagnosis not present

## 2022-04-17 DIAGNOSIS — Z6841 Body Mass Index (BMI) 40.0 and over, adult: Secondary | ICD-10-CM | POA: Diagnosis not present

## 2022-04-17 NOTE — Progress Notes (Signed)
Appt. Start Time: 1730 Appt. End Time: 2000  Class 2 Nutritional Management - identify sources of carbohydrate, protein and fat; plan balanced meals; estimate servings of carbohydrates in meals  Psychosocial - identify DM as a source of stress; state the effects of stress on BG control  Exercise - describe the effects of exercise on blood glucose and importance of regular exercise in controlling diabetes; state a plan for personal exercise; verbalize contraindications for exercise  Self-Monitoring - state importance of SMBG; use SMBG results to effectively manage diabetes; identify importance of regular HbA1C testing and goals for results  Acute Complications - recognize hyperglycemia and hypoglycemia with causes and effects; identify blood glucose results as high, low or in control; list steps in treating and preventing high and low blood glucose  Sick Day Guidelines: state appropriate measure to manage blood glucose when ill (need for meds, HBGM plan, when to call physician, need for fluids)  Chronic Complications/Foot, Skin, Eye Dental Care - identify possible long-term complications of diabetes (retinopathy, neuropathy, nephropathy, cardiovascular disease, infections); explain steps in prevention and treatment of chronic complications; state importance of daily self-foot exams; describe how to examine feet and what to look for; explain appropriate eye and dental care  Lifestyle Changes/Goals - state benefits of making appropriate lifestyle changes; identify habits that need to change (meals, tobacco, alcohol); identify strategies to reduce risk factors for personal health  Pregnancy/Sexual Health - state importance of good blood glucose control in preventing sexual problems (impotence, vaginal dryness, infections, loss of desire)  Teaching Materials Used: Class 2 Slide Packet A1C Pamphlet Foot Care Literature Kidney Test Handout Stroke Card Quick and "Balanced" Meal Ideas Carb  Counting and Meal Planning Book Goals for Class 2  

## 2022-04-24 ENCOUNTER — Encounter: Payer: Self-pay | Admitting: *Deleted

## 2022-04-24 ENCOUNTER — Encounter: Payer: BC Managed Care – PPO | Admitting: *Deleted

## 2022-04-24 VITALS — BP 160/92 | Wt 346.8 lb

## 2022-04-24 DIAGNOSIS — E119 Type 2 diabetes mellitus without complications: Secondary | ICD-10-CM | POA: Diagnosis not present

## 2022-04-24 DIAGNOSIS — Z713 Dietary counseling and surveillance: Secondary | ICD-10-CM | POA: Diagnosis not present

## 2022-04-24 DIAGNOSIS — Z6841 Body Mass Index (BMI) 40.0 and over, adult: Secondary | ICD-10-CM | POA: Diagnosis not present

## 2022-04-24 NOTE — Progress Notes (Signed)
Appt. Start Time: 1715 Appt. End Time: 2015  Patient requested referral to [facility] for counseling session. Email sent at 7:00 pm for them to contact patient and schedule an appointment.   Class 3 Diabetes Overview - identify functions of pancreas and insulin; define insulin deficiency vs insulin resistance  Medications - state name, dose, timing of currently prescribed medications; describe types of medications available for diabetes  Psychosocial - identify DM as a source of stress; state the effects of stress on BG control; verbalize appropriate stress management techniques; identify personal stress issues   Nutritional Management - use food labels to identify serving size, content of carbohydrate, fiber, protein, fat, saturated fat and sodium; recognize food sources of fat, saturated fat, trans fat, and sodium, and verbalize goals for intake; describe healthful, appropriate food choices when dining out   Exercise - state a plan for personal exercise; verbalize contraindications for exercise  Self-Monitoring - state importance of SMBG; use SMBG results to effectively manage diabetes; identify importance of regular HbA1C testing and goals for results  Acute Complications - recognize hyperglycemia and hypoglycemia with causes and effects; identify blood glucose results as high, low or in control; list steps in treating and preventing high and low blood glucose  Chronic Complications - state importance of daily self-foot exams; explain appropriate eye and dental care  Lifestyle Changes/Goals & Health/Community Resources - set goals for proper diabetes care; state need for and frequency of healthcare follow-up; describe appropriate community resources for good health (ADA, web sites, apps)   Teaching Materials Used: Class 3 Slide Packet Diabetes Stress Test Stress Management Tools Stress Poem Goal Setting Worksheet Website/App List

## 2022-04-25 ENCOUNTER — Encounter: Payer: Self-pay | Admitting: *Deleted

## 2022-05-12 ENCOUNTER — Ambulatory Visit: Payer: BC Managed Care – PPO | Admitting: Family Medicine

## 2022-05-12 ENCOUNTER — Ambulatory Visit: Payer: Self-pay

## 2022-05-12 DIAGNOSIS — R051 Acute cough: Secondary | ICD-10-CM | POA: Diagnosis not present

## 2022-05-12 DIAGNOSIS — J101 Influenza due to other identified influenza virus with other respiratory manifestations: Secondary | ICD-10-CM | POA: Diagnosis not present

## 2022-05-12 DIAGNOSIS — R519 Headache, unspecified: Secondary | ICD-10-CM | POA: Diagnosis not present

## 2022-05-12 DIAGNOSIS — R509 Fever, unspecified: Secondary | ICD-10-CM | POA: Diagnosis not present

## 2022-05-12 DIAGNOSIS — M791 Myalgia, unspecified site: Secondary | ICD-10-CM | POA: Diagnosis not present

## 2022-05-12 NOTE — Telephone Encounter (Signed)
Message from Sharene Skeans sent at 05/12/2022 10:49 AM EST  Summary: flu like symptoms   Pt has flu like symptoms/ chills, headache, body aches, cough that started yesterday / pt wanted appt for today/ please advise         Chief Complaint: flu + Symptoms: cough, chills, fever, headache Frequency: yesterday Pertinent Negatives: Patient denies SOB, dehydration Disposition: [] ED /[] Urgent Care (no appt availability in office) / [] Appointment(In office/virtual)/ []  [facility]/ [] Home Care/ [] Refused Recommended Disposition /[] [facility]/ []  Follow-up with PCP Additional Notes: pt went to Urgent Care after the first call was placed this am . Pt was dx with Flu and was prescribed Tamiflu. No other action is needed. Routing for FYI  Reason for Disposition  Patient is HIGH RISK (e.g., age > 37 years, pregnant, HIV+, or chronic medical condition)    Pt went to UC and was prescribed tamiflu- pt is a diabetic  Answer Assessment - Initial Assessment Questions 1. WORST SYMPTOM: "What is your worst symptom?" (e.g., cough, runny nose, muscle aches, headache, sore throat, fever)      Headache and chills- cough 2. ONSET: "When did your flu symptoms start?"      Yesterday  3. COUGH: "How bad is the cough?"       Frequent coughing spells- phlegm is yellow 4. RESPIRATORY DISTRESS: "Describe your breathing."      No  5. FEVER: "Do you have a fever?" If Yes, ask: "What is your temperature, how was it measured, and when did it start?"     103 6. EXPOSURE: "Were you exposed to someone with influenza?"       yes 7. FLU VACCINE: "Did you get a flu shot this year?"     no 8. HIGH RISK DISEASE: "Do you have any chronic medical problems?" (e.g., heart or lung disease, asthma, weak immune system, or other HIGH RISK conditions)     diabetes 9. PREGNANCY: "Is there any chance you are pregnant?" "When was your last menstrual period?"     N/a 10. OTHER SYMPTOMS: "Do you have any other  symptoms?"  (e.g., runny nose, muscle aches, headache, sore throat)       Muscle aches, headache,  Protocols used: Influenza (Flu) - [facility]

## 2022-05-12 NOTE — Telephone Encounter (Signed)
Patient called, left VM to return the call to the office to discuss symptoms with a nurse.  Summary: flu like symptoms   Pt has flu like symptoms/ chills, headache, body aches, cough that started yesterday / pt wanted appt for today/ please advise

## 2022-06-22 ENCOUNTER — Ambulatory Visit: Payer: Self-pay

## 2022-06-22 ENCOUNTER — Encounter: Payer: Self-pay | Admitting: Family Medicine

## 2022-06-22 ENCOUNTER — Other Ambulatory Visit: Payer: Self-pay | Admitting: Family Medicine

## 2022-06-22 ENCOUNTER — Ambulatory Visit (INDEPENDENT_AMBULATORY_CARE_PROVIDER_SITE_OTHER): Payer: BC Managed Care – PPO | Admitting: Family Medicine

## 2022-06-22 VITALS — BP 164/99 | HR 105 | Temp 97.8°F | Wt 340.8 lb

## 2022-06-22 DIAGNOSIS — E1159 Type 2 diabetes mellitus with other circulatory complications: Secondary | ICD-10-CM | POA: Diagnosis not present

## 2022-06-22 DIAGNOSIS — R310 Gross hematuria: Secondary | ICD-10-CM

## 2022-06-22 DIAGNOSIS — R369 Urethral discharge, unspecified: Secondary | ICD-10-CM

## 2022-06-22 DIAGNOSIS — Z794 Long term (current) use of insulin: Secondary | ICD-10-CM

## 2022-06-22 DIAGNOSIS — I152 Hypertension secondary to endocrine disorders: Secondary | ICD-10-CM | POA: Diagnosis not present

## 2022-06-22 DIAGNOSIS — H6123 Impacted cerumen, bilateral: Secondary | ICD-10-CM

## 2022-06-22 DIAGNOSIS — E1165 Type 2 diabetes mellitus with hyperglycemia: Secondary | ICD-10-CM

## 2022-06-22 LAB — POCT URINALYSIS DIPSTICK
Bilirubin, UA: NEGATIVE
Glucose, UA: NEGATIVE
Ketones, UA: NEGATIVE
Leukocytes, UA: NEGATIVE
Nitrite, UA: NEGATIVE
Protein, UA: POSITIVE — AB
Spec Grav, UA: 1.015 (ref 1.010–1.025)
Urobilinogen, UA: 0.2 E.U./dL
pH, UA: 6 (ref 5.0–8.0)

## 2022-06-22 MED ORDER — LOSARTAN POTASSIUM-HCTZ 100-25 MG PO TABS: 1.0000 | ORAL_TABLET | Freq: Every day | ORAL | 1 refills | Status: DC

## 2022-06-22 NOTE — Assessment & Plan Note (Signed)
Acute, self limiting following complaints of 2 weeks of congestion Irrigated in office today without difficult Encouraged to use flonase or OTC ear gtts to assist if needed

## 2022-06-22 NOTE — Assessment & Plan Note (Signed)
Unknown cause, denies injury or trauma Has been taking OTC cough/cold medication Has not been sexually active in ~1 month; low risk for STIs per pt report Reports blood in underwear upon awakening as well in urine Recommend Ucx  Denies bowel or bladder changes -CVA tenderness

## 2022-06-22 NOTE — Progress Notes (Signed)
I,Connie R Striblin,acting as a Education administrator for Robert Sprout, FNP.,have documented all relevant documentation on the behalf of Robert Sprout, FNP,as directed by  Robert Sprout, FNP while in the presence of Robert Sprout, FNP.   Established patient visit   Patient: Robert Whitaker   DOB: 05-Feb-1991   32 y.o. Male  MRN: [id number] Visit Date: 06/22/2022  Today's healthcare provider: [name], FNP  Introduced to nurse practitioner role and practice setting.  All questions answered.  Discussed provider/patient relationship and expectations.  Chief Complaint  Patient presents with   Penile Discharge   Subjective    HPI  Penile Discharge:  He reports new onset bloody discharge from penis  The current episode started today and is staying constant. Patient states symptoms are moderate in intensity, occurring constantly. He  has not been recently treated for similar symptoms.    Associated symptoms: No abdominal pain No back pain  No chills No constipation  No cramping No diarrhea  Yes discharge No fever  Yes hematuria No nausea  No vomiting    No known STD exposure  --------------------------------------------------------------------------------------- Upper respiratory symptoms He complains of congestion, sinus pressure, and tinnitus  .with no fever, chills, night sweats or weight loss. Onset of symptoms was 2 weeks ago and gradually improving.He is drinking plenty of fluids.  Past history is significant for no history of pneumonia or bronchitis. Patient is non-smoker  ---------------------------------------------------------------------------------------------------   Medications: Outpatient Medications Prior to Visit  Medication Sig   ACCU-CHEK GUIDE test strip Check blood glucose as directed up to 4 times a day.   Accu-Chek Softclix Lancets lancets Use as instructed check fasting blood glucose up to 4 times daily.   blood glucose meter kit and supplies Dispense based on  patient and insurance preference.Check blood glucose fasting q am.. (FOR ICD-10 E10.9, E11.9).   ciclopirox (PENLAC) 8 % solution Apply topically.   diclofenac Sodium (VOLTAREN) 1 % GEL Apply topically 4 (four) times daily.   econazole nitrate 1 % cream Apply topically.   EPINEPHrine 0.3 mg/0.3 mL IJ SOAJ injection Inject 0.3 mLs (0.3 mg total) into the muscle as needed for anaphylaxis.   metFORMIN (GLUCOPHAGE) 500 MG tablet Take 1 tablet (500 mg total) by mouth 2 (two) times daily with a meal.   VITAMIN D PO Take 1 capsule by mouth daily.   [DISCONTINUED] diclofenac (VOLTAREN) 75 MG EC tablet Take 75 mg by mouth 2 (two) times daily.   No facility-administered medications prior to visit.    Review of Systems     Objective    BP (!) 164/99 (BP Location: Left Arm, Patient Position: Sitting, Cuff Size: Large)   Pulse (!) 105   Temp 97.8 F (36.6 C) (Oral)   Wt (!) 340 lb 12.8 oz (154.6 kg)   SpO2 99%   BMI 48.21 kg/m    Physical Exam Vitals and nursing note reviewed.  Constitutional:      General: He is not in acute distress.    Appearance: Normal appearance. He is obese. He is not ill-appearing, toxic-appearing or diaphoretic.  HENT:     Head: Normocephalic and atraumatic.     Right Ear: There is impacted cerumen.     Left Ear: There is impacted cerumen.     Nose: Congestion present.     Mouth/Throat:     Mouth: Mucous membranes are moist.     Pharynx: Oropharynx is clear. No oropharyngeal exudate or posterior oropharyngeal erythema.  Eyes:     Pupils: Pupils are equal, round, and reactive to light.  Cardiovascular:     Rate and Rhythm: Normal rate and regular rhythm.     Pulses: Normal pulses.     Heart sounds: Normal heart sounds.  Pulmonary:     Effort: Pulmonary effort is normal.     Breath sounds: Normal breath sounds.  Abdominal:     General: There is no distension.     Tenderness: There is no abdominal tenderness. There is no right CVA tenderness or left CVA  tenderness.  Genitourinary:    Comments: Endorses blood from inside of his penis; noted in his underwear and in his urine; deferred exam.  Musculoskeletal:        General: Normal range of motion.     Cervical back: Normal range of motion.  Skin:    General: Skin is warm and dry.     Capillary Refill: Capillary refill takes less than 2 seconds.  Neurological:     General: No focal deficit present.     Mental Status: He is alert and oriented to person, place, and time. Mental status is at baseline.  Psychiatric:        Mood and Affect: Mood normal.        Behavior: Behavior normal.        Thought Content: Thought content normal.        Judgment: Judgment normal.     Results for orders placed or performed in visit on 06/22/22  POCT Urinalysis Dipstick  Result Value Ref Range   Color, UA dark yellow    Clarity, UA clear    Glucose, UA Negative Negative   Bilirubin, UA negative    Ketones, UA negative    Spec Grav, UA 1.015 1.010 - 1.025   Blood, UA large    pH, UA 6.0 5.0 - 8.0   Protein, UA Positive (A) Negative   Urobilinogen, UA 0.2 0.2 or 1.0 E.U./dL   Nitrite, UA negative    Leukocytes, UA Negative Negative   Appearance     Odor      Assessment & Plan     Problem List Items Addressed This Visit       Cardiovascular and Mediastinum   Hypertension associated with diabetes ([facility])    Chronic, uncontrolled May be linked to recent use of OTC cold/congestion medications; however, pt notes not having to sign for anything from behind the counter and just bought mucinex off the shelf Discussed goal of HTN <130/<80 with concurrent DM Pt is at high risk for HTN complications given family hx of HTN and mother with HTN kidney failure resulting in her death Recommend medication start with close f/u      Relevant Medications   losartan-hydrochlorothiazide (HYZAAR) 100-25 MG tablet   Other Relevant Orders   Comprehensive Metabolic Panel (CMET)   CBC with Differential/Platelet    Hemoglobin A1c     Endocrine   Type 2 diabetes mellitus with hyperglycemia, with long-term current use of insulin (HCC)    Chronic, not well controlled. Last A1c at 6.8% and pt notes to not be taking metformin as prescribed Repeat A1c Continue to recommend balanced, lower carb meals. Smaller meal size, adding snacks. Choosing water as drink of choice and increasing purposeful exercise. Discussed addition of injectable medication to assist with morbid obesity; pt refers addition at this time as his numbers "are good"      Relevant Medications   losartan-hydrochlorothiazide (HYZAAR) 100-25 MG tablet  Other Relevant Orders   Comprehensive Metabolic Panel (CMET)   CBC with Differential/Platelet   Hemoglobin A1c     Nervous and Auditory   Excessive cerumen in both ear canals    Acute, self limiting following complaints of 2 weeks of congestion Irrigated in office today without difficult Encouraged to use flonase or OTC ear gtts to assist if needed         Genitourinary   Gross hematuria    Unknown cause, denies injury or trauma Has been taking OTC cough/cold medication Has not been sexually active in ~1 month; low risk for STIs per pt report Reports blood in underwear upon awakening as well in urine Recommend Ucx  Denies bowel or bladder changes -CVA tenderness        Other   Morbid obesity (HCC)    Chronic, Body mass index is 48.21 kg/m. Associated with HTN and DM Discussed importance of healthy weight management Discussed diet and exercise       Penile discharge - Primary    Unknown cause, denies injury or trauma Has been taking OTC cough/cold medication Has not been sexually active in ~1 month; low risk for STIs per pt report Reports blood in underwear upon awakening as well in urine Recommend Ucx  Denies bowel or bladder changes -CVA tenderness      Relevant Orders   POCT Urinalysis Dipstick (Completed)   Return in about 4 weeks (around 07/20/2022) for  blood pressure check, HTN management.     Vonna Kotyk, FNP, have reviewed all documentation for this visit. The documentation on 06/22/22 for the exam, diagnosis, procedures, and orders are all accurate and complete.  Robert Whitaker, [place] [phone] (phone) [phone] (fax)  [place]

## 2022-06-22 NOTE — Assessment & Plan Note (Signed)
Chronic, Body mass index is 48.21 kg/m. Associated with HTN and DM Discussed importance of healthy weight management Discussed diet and exercise

## 2022-06-22 NOTE — Assessment & Plan Note (Signed)
Chronic, not well controlled. Last A1c at 6.8% and pt notes to not be taking metformin as prescribed Repeat A1c Continue to recommend balanced, lower carb meals. Smaller meal size, adding snacks. Choosing water as drink of choice and increasing purposeful exercise. Discussed addition of injectable medication to assist with morbid obesity; pt refers addition at this time as his numbers "are good"

## 2022-06-22 NOTE — Telephone Encounter (Signed)
     Chief Complaint: Bloody discharge from penis Symptoms: Above Frequency: Today Pertinent Negatives: Patient denies fever or pain Disposition: [] ED /[] Urgent Care (no appt availability in office) / [x] Appointment(In office/virtual)/ []  [facility]/ [] Home Care/ [] Refused Recommended Disposition /[] [facility]/ []  Follow-up with PCP Additional Notes:    Reason for Disposition  Blood in urine (red, pink, or tea-colored)  Answer Assessment - Initial Assessment Questions 1. SYMPTOM: "What's the main symptom you're concerned about?" (e.g., discharge from penis, rash, pain, itching, swelling)     Bloody discharge 2. LOCATION: "Where is the  located?"     Outside  3. ONSET: "When did   start?"     Today 4. PAIN: "Is there any pain?" If Yes, ask: "How bad is it?"  (Scale 1-10; or mild, moderate, severe)     No 5. URINE: "Any difficulty passing urine?" If Yes, ask: "When was the last time?"     No 6. CAUSE: "What do you think is causing the symptoms?"     Unsure 7. OTHER SYMPTOMS: "Do you have any other symptoms?" (e.g., fever, abdomen pain, blood in urine)     No  Protocols used: Penis and Scrotum Symptoms-A-AH

## 2022-06-22 NOTE — Assessment & Plan Note (Signed)
Chronic, uncontrolled May be linked to recent use of OTC cold/congestion medications; however, pt notes not having to sign for anything from behind the counter and just bought mucinex off the shelf Discussed goal of HTN <130/<80 with concurrent DM Pt is at high risk for HTN complications given family hx of HTN and mother with HTN kidney failure resulting in her death Recommend medication start with close f/u

## 2022-06-23 ENCOUNTER — Telehealth: Payer: Self-pay

## 2022-06-23 DIAGNOSIS — H9311 Tinnitus, right ear: Secondary | ICD-10-CM | POA: Diagnosis not present

## 2022-06-23 DIAGNOSIS — R319 Hematuria, unspecified: Secondary | ICD-10-CM | POA: Diagnosis not present

## 2022-06-23 LAB — CBC WITH DIFFERENTIAL/PLATELET
Basophils Absolute: 0 10*3/uL (ref 0.0–0.2)
Basos: 0 %
EOS (ABSOLUTE): 0 10*3/uL (ref 0.0–0.4)
Eos: 1 %
Hematocrit: 39.7 % (ref 37.5–51.0)
Hemoglobin: 13.5 g/dL (ref 13.0–17.7)
Immature Grans (Abs): 0.1 10*3/uL (ref 0.0–0.1)
Immature Granulocytes: 1 %
Lymphocytes Absolute: 1.8 10*3/uL (ref 0.7–3.1)
Lymphs: 21 %
MCH: 28.4 pg (ref 26.6–33.0)
MCHC: 34 g/dL (ref 31.5–35.7)
MCV: 83 fL (ref 79–97)
Monocytes Absolute: 0.7 10*3/uL (ref 0.1–0.9)
Monocytes: 8 %
Neutrophils Absolute: 6.1 10*3/uL (ref 1.4–7.0)
Neutrophils: 69 %
Platelets: 348 10*3/uL (ref 150–450)
RBC: 4.76 x10E6/uL (ref 4.14–5.80)
RDW: 13.4 % (ref 11.6–15.4)
WBC: 8.7 10*3/uL (ref 3.4–10.8)

## 2022-06-23 LAB — COMPREHENSIVE METABOLIC PANEL
ALT: 26 IU/L (ref 0–44)
AST: 20 IU/L (ref 0–40)
Albumin/Globulin Ratio: 1.3 (ref 1.2–2.2)
Albumin: 4.2 g/dL (ref 4.1–5.1)
Alkaline Phosphatase: 73 IU/L (ref 44–121)
BUN/Creatinine Ratio: 12 (ref 9–20)
BUN: 12 mg/dL (ref 6–20)
Bilirubin Total: 0.4 mg/dL (ref 0.0–1.2)
CO2: 23 mmol/L (ref 20–29)
Calcium: 9.6 mg/dL (ref 8.7–10.2)
Chloride: 103 mmol/L (ref 96–106)
Creatinine, Ser: 1.03 mg/dL (ref 0.76–1.27)
Globulin, Total: 3.2 g/dL (ref 1.5–4.5)
Glucose: 138 mg/dL — ABNORMAL HIGH (ref 70–99)
Potassium: 4.7 mmol/L (ref 3.5–5.2)
Sodium: 141 mmol/L (ref 134–144)
Total Protein: 7.4 g/dL (ref 6.0–8.5)
eGFR: 99 mL/min/{1.73_m2} (ref >59)

## 2022-06-23 LAB — HEMOGLOBIN A1C
Est. average glucose Bld gHb Est-mCnc: 143 mg/dL
Hgb A1c MFr Bld: 6.6 % — ABNORMAL HIGH (ref 4.8–5.6)

## 2022-06-23 NOTE — Progress Notes (Signed)
All labs are normal and stable. A1c remains at 6.6% which is well controlled <7%. Please follow up with your PCP regarding your change in metformin dosing/usage and blood pressure start.  Please let us know if you have any questions.  Thank you, [name], [address] [place], [place] [address] [phone] (phone) [phone] (fax) [employer] [facility]

## 2022-06-23 NOTE — Telephone Encounter (Signed)
Pt given lab results per notes of Daneil Dan, [place] on 06/23/22. Pt verbalized understanding. He asked about the urine test that was done yesterday in the office. Advised it looks like there is a Urine Culture to be done and that test can take a couple days to result, so someone will call once it's done. He says in the MyChart he saw the dipstick urine. I asked was that done in the office, he says yes, advised it didn't show anything except high protein, so that's why the culture was ordered.     [name], FNP 06/23/2022  8:16 AM EST     All labs are normal and stable. A1c remains at 6.6% which is well controlled <7%. Please follow up with your PCP regarding your change in metformin dosing/usage and blood pressure start.   Please let us know if you have any questions.   Thank you, [name], [address] [place], [place] [address] [phone] (phone) [phone] (fax) [place]

## 2022-06-26 LAB — URINE CULTURE

## 2022-06-26 NOTE — Progress Notes (Signed)
No single bacteria noted on urine culture

## 2022-06-30 ENCOUNTER — Encounter: Payer: Self-pay | Admitting: Family Medicine

## 2022-07-04 ENCOUNTER — Ambulatory Visit (INDEPENDENT_AMBULATORY_CARE_PROVIDER_SITE_OTHER): Payer: BC Managed Care – PPO | Admitting: Physician Assistant

## 2022-07-04 ENCOUNTER — Encounter: Payer: Self-pay | Admitting: Physician Assistant

## 2022-07-04 VITALS — BP 144/85 | HR 99 | Wt 332.4 lb

## 2022-07-04 DIAGNOSIS — H9202 Otalgia, left ear: Secondary | ICD-10-CM | POA: Diagnosis not present

## 2022-07-04 DIAGNOSIS — H9312 Tinnitus, left ear: Secondary | ICD-10-CM

## 2022-07-04 MED ORDER — CIPROFLOXACIN-DEXAMETHASONE 0.3-0.1 % OT SUSP: 4.0000 [drp] | Freq: Two times a day (BID) | OTIC | 0 refills | Status: DC

## 2022-07-04 NOTE — Progress Notes (Signed)
Established patient visit   Patient: Robert Whitaker   DOB: 1990-09-07   32 y.o. Male  MRN: [id number] Visit Date: 07/04/2022  Today's healthcare provider: [name], PA-C  CC: tinnitus and left ear pain  Subjective    HPI  Patient reports he no longer has a sore throat but he continues to have tinnitius. States it started around [facility] Sunday and hs not went away since and has been stable. Patient reports that it is constant, associated with dizziness, pressure in ears kind of like when you are in a airplane and your eras are popping. Patient reports he is also taking tamiflu as suggested but has not helped at all. Reports when it first started he had a cold.   Tinnitus: Patient presents with tinnitus. Onset of symptoms was abrupt onse 20 days ago.  Patient describes the tinnitus as constant located in the left ear. The quality is described as low range pitch that sounds like gong/bell during th boxmatch. The pattern is nonpulsatile with an intensity that is medium. Patient describes his level of annoyance as moderately annoying, always aware. Associated symptoms include pain Family history is negative family history for tinnitus Patient has had no prior evaluation, treatment or surgery for tinnitus Patient does not have hearing aids at this time. Previous treatments include flonase OTC  Medications: Outpatient Medications Prior to Visit  Medication Sig   ACCU-CHEK GUIDE test strip Check blood glucose as directed up to 4 times a day.   Accu-Chek Softclix Lancets lancets Use as instructed check fasting blood glucose up to 4 times daily.   blood glucose meter kit and supplies Dispense based on patient and insurance preference.Check blood glucose fasting q am.. (FOR ICD-10 E10.9, E11.9).   ciclopirox (PENLAC) 8 % solution Apply topically.   diclofenac Sodium (VOLTAREN) 1 % GEL Apply topically 4 (four) times daily.   econazole nitrate 1 % cream Apply topically.   EPINEPHrine 0.3  mg/0.3 mL IJ SOAJ injection Inject 0.3 mLs (0.3 mg total) into the muscle as needed for anaphylaxis.   losartan-hydrochlorothiazide (HYZAAR) 100-25 MG tablet Take 1 tablet by mouth daily.   metFORMIN (GLUCOPHAGE) 500 MG tablet Take 1 tablet (500 mg total) by mouth 2 (two) times daily with a meal.   VITAMIN D PO Take 1 capsule by mouth daily.   No facility-administered medications prior to visit.    Review of Systems  All other systems reviewed and are negative. Except see hpi     Objective    BP (!) 144/85 (BP Location: Left Arm, Patient Position: Sitting, Cuff Size: Large)   Pulse 99   Wt (!) 332 lb 6.4 oz (150.8 kg)   SpO2 100%   BMI 47.02 kg/m    Physical Exam Vitals reviewed.  Constitutional:      General: He is not in acute distress.    Appearance: Normal appearance. He is not diaphoretic.  HENT:     Head: Normocephalic and atraumatic.  Eyes:     General: No scleral icterus.    Conjunctiva/sclera: Conjunctivae normal.  Cardiovascular:     Rate and Rhythm: Normal rate and regular rhythm.     Pulses: Normal pulses.     Heart sounds: Normal heart sounds. No murmur heard. Pulmonary:     Effort: Pulmonary effort is normal. No respiratory distress.     Breath sounds: Normal breath sounds. No wheezing or rhonchi.  Musculoskeletal:     Cervical back: Neck supple.     Right  lower leg: No edema.     Left lower leg: No edema.  Lymphadenopathy:     Cervical: No cervical adenopathy.  Skin:    General: Skin is warm and dry.     Findings: No rash.  Neurological:     Mental Status: He is alert and oriented to person, place, and time. Mental status is at baseline.  Psychiatric:        Mood and Affect: Mood normal.        Behavior: Behavior normal.       No results found for any visits on 07/04/22.  Assessment & Plan     1. Left ear pain X 1 mo Could be post viral  Advised to continue with symptomatic treatment including nasal saline rinse and flonase OTC, Pat was  explained in details benefits and uses of symptomatic treatment. Called in  - ciprofloxacin-dexamethasone (CIPRODEX) OTIC suspension; Place 4 drops into the left ear 2 (two) times daily.  Dispense: 7.5 mL; Refill: 0 Will continue with antihistamines OTC/benadryl at bedtime  2. Tinnitus of left ear With a "clogged ear" X 1 mo No hearing loss, no vertigo Could be post viral Advised to proceed with OTC flonase and nasal saline rinse In the past , pt has similar symptoms with ear infection and URI. The patient was advised to call back or seek an in-person evaluation if the symptoms worsen or if the condition fails to improve as anticipated.   I discussed the assessment and treatment plan with the patient. The patient was provided an opportunity to ask questions and all were answered. The patient agreed with the plan and demonstrated an understanding of the instructions.  I, [name], PA-C have reviewed all documentation for this visit. The documentation on  07/04/22 for the exam, diagnosis, procedures, and orders are all accurate and complete.  [name], [facility], [place] [phone] (phone) [phone] (fax)   [place]

## 2022-07-21 ENCOUNTER — Encounter: Payer: Self-pay | Admitting: Physician Assistant

## 2022-07-21 ENCOUNTER — Ambulatory Visit (INDEPENDENT_AMBULATORY_CARE_PROVIDER_SITE_OTHER): Payer: BC Managed Care – PPO | Admitting: Physician Assistant

## 2022-07-21 DIAGNOSIS — E1159 Type 2 diabetes mellitus with other circulatory complications: Secondary | ICD-10-CM | POA: Diagnosis not present

## 2022-07-21 DIAGNOSIS — I152 Hypertension secondary to endocrine disorders: Secondary | ICD-10-CM | POA: Diagnosis not present

## 2022-07-21 DIAGNOSIS — E119 Type 2 diabetes mellitus without complications: Secondary | ICD-10-CM | POA: Diagnosis not present

## 2022-07-21 NOTE — Progress Notes (Signed)
Established Patient Office Visit  Subjective   Patient ID: Robert Whitaker, male    DOB: 19-May-1990  Age: 32 y.o. MRN: [id number]  Chief Complaint  Patient presents with   Hypertension    Patient presents today for hypertension follow up. Patient has no other complaints or concerns. Patient reports he has been going to the gym and following a healthy food plan. He completed his course with nutritionist last year which lasted a mo.    Hypertension This is a chronic problem. The problem is controlled. Pertinent negatives include no chest pain, headaches, orthopnea, palpitations, peripheral edema or shortness of breath. Risk factors for coronary artery disease include male gender, stress, diabetes mellitus and sedentary lifestyle. The current treatment provides significant improvement. There are no compliance problems.   120/80 at home, BP    Review of Systems  Respiratory:  Negative for shortness of breath.   Cardiovascular:  Negative for chest pain, palpitations and orthopnea.  Neurological:  Negative for headaches.  All other systems reviewed and are negative.     Objective:     BP 119/78   Pulse (!) 102   Temp 98.4 F (36.9 C) (Oral)   Ht 5' 10.5" (1.791 m)   Wt (!) 328 lb (148.8 kg)   SpO2 100%   BMI 46.40 kg/m    Physical Exam Vitals reviewed.  Constitutional:      General: He is not in acute distress.    Appearance: Normal appearance. He is obese. He is not diaphoretic.  HENT:     Head: Normocephalic and atraumatic.  Eyes:     General: No scleral icterus.    Conjunctiva/sclera: Conjunctivae normal.  Cardiovascular:     Rate and Rhythm: Normal rate and regular rhythm.     Pulses: Normal pulses.     Heart sounds: Normal heart sounds. No murmur heard. Pulmonary:     Effort: Pulmonary effort is normal. No respiratory distress.     Breath sounds: Normal breath sounds. No wheezing or rhonchi.  Musculoskeletal:     Cervical back: Neck supple.     Right  lower leg: No edema.     Left lower leg: No edema.  Lymphadenopathy:     Cervical: No cervical adenopathy.  Skin:    General: Skin is warm and dry.     Findings: No rash.  Neurological:     Mental Status: He is alert and oriented to person, place, and time. Mental status is at baseline.  Psychiatric:        Behavior: Behavior normal.        Thought Content: Thought content normal.        Judgment: Judgment normal.      No results found for any visits on 07/21/22.    The ASCVD Risk score (Arnett DK, et al., 2019) failed to calculate for the following reasons:   The 2019 ASCVD risk score is only valid for ages 5 to 52    Assessment & Plan:   Problem List Items Addressed This Visit       Cardiovascular and Mediastinum   Hypertension associated with diabetes ([place])     Other   Morbid obesity ([place]) - Primary   Other Visit Diagnoses     Diabetes mellitus without complication ([place])          1. Morbid obesity (HCC) Chronic and improving. Congratulated pt with weigh loss of 4 pounds/was seen on 07/01/22 Continue low carb diet  Continue exercise routine Weight loss  of 5% of pt's current weight via healthy diet and daily exercise encouraged. Will FU in 3 mo  2. Diabetes mellitus without complication (HCC) Chronic and stable A1C from a mo ago 6.6 Continue Metformin 500mg  BID Continue low carb diet Will Fu in 3 mo  3. Hypertension associated with diabetes ([place]) Chronic and stable Bp today at goal Continue Hyzaar 100-25mg  daily Continue low sodium diet  Will FU in 3 mo  Return in about 3 months (around 10/21/2022) for chronic disease f/u, BP f/u, CPE in april.   The patient was advised to call back or seek an in-person evaluation if the symptoms worsen or if the condition fails to improve as anticipated.  I discussed the assessment and treatment plan with the patient. The patient was provided an opportunity to ask questions and all were answered. The patient agreed  with the plan and demonstrated an understanding of the instructions.  I, [name], PA-C have reviewed all documentation for this visit. The documentation on 07/21/22 for the exam, diagnosis, procedures, and orders are all accurate and complete.  [name], [facility], [place] [phone] (phone) [phone] (fax)   [name], PA-C

## 2022-08-18 ENCOUNTER — Encounter: Payer: BC Managed Care – PPO | Admitting: Physician Assistant

## 2022-09-01 ENCOUNTER — Encounter: Payer: Self-pay | Admitting: Physician Assistant

## 2022-09-01 ENCOUNTER — Ambulatory Visit (INDEPENDENT_AMBULATORY_CARE_PROVIDER_SITE_OTHER): Payer: BC Managed Care – PPO | Admitting: Physician Assistant

## 2022-09-01 VITALS — BP 131/78 | HR 90 | Temp 98.0°F | Resp 16 | Ht 70.5 in | Wt 336.0 lb

## 2022-09-01 DIAGNOSIS — E119 Type 2 diabetes mellitus without complications: Secondary | ICD-10-CM

## 2022-09-01 DIAGNOSIS — I152 Hypertension secondary to endocrine disorders: Secondary | ICD-10-CM

## 2022-09-01 DIAGNOSIS — E1159 Type 2 diabetes mellitus with other circulatory complications: Secondary | ICD-10-CM | POA: Diagnosis not present

## 2022-09-01 DIAGNOSIS — Z0000 Encounter for general adult medical examination without abnormal findings: Secondary | ICD-10-CM

## 2022-09-01 LAB — POCT URINALYSIS DIPSTICK
Bilirubin, UA: NEGATIVE
Blood, UA: NEGATIVE
Glucose, UA: NEGATIVE
Ketones, UA: NEGATIVE
Leukocytes, UA: NEGATIVE
Nitrite, UA: NEGATIVE
Protein, UA: NEGATIVE
Spec Grav, UA: 1.015 (ref 1.010–1.025)
Urobilinogen, UA: 0.2 E.U./dL
pH, UA: 6.5 (ref 5.0–8.0)

## 2022-09-01 NOTE — Progress Notes (Unsigned)
I,Sulibeya S Dimas,acting as a Neurosurgeon for [employer], PA-C.,have documented all relevant documentation on the behalf of Debera Lat, PA-C,as directed by  [employer], PA-C while in the presence of [employer], PA-C.   Complete physical exam  Patient: Robert Whitaker   DOB: [birth year]   31 y.o. Male  MRN: [id number] Visit Date: 09/01/2022  Today's healthcare provider: Debera Lat, PA-C   Chief Complaint  Patient presents with   Annual Exam   Subjective    Robert Whitaker is a 32 y.o. male who presents today for a complete physical exam.  He reports consuming a general and diabetic, low calorie diet. Gym/ health club routine includes cardio, mod to heavy weightlifting, and treadmill. He generally feels well. He reports sleeping well. He does not have additional problems to discuss today.  HPI    Last depression screening scores    07/21/2022    8:20 AM 07/04/2022   10:08 AM 04/07/2022   10:22 AM  PHQ 2/9 Scores  PHQ - 2 Score 0 0 1  PHQ- 9 Score 0 2 4   Last fall risk screening    07/21/2022    8:20 AM  Fall Risk   Falls in the past year? 0   Last Audit-C alcohol use screening    07/21/2022    8:20 AM  Alcohol Use Disorder Test (AUDIT)  1. How often do you have a drink containing alcohol? 1  2. How many drinks containing alcohol do you have on a typical day when you are drinking? 0  3. How often do you have six or more drinks on one occasion? 0  AUDIT-C Score 1   A score of 3 or more in women, and 4 or more in men indicates increased risk for alcohol abuse, EXCEPT if all of the points are from question 1   Past Medical History:  Diagnosis Date   Diabetes mellitus without complication (HCC)    Past Surgical History:  Procedure Laterality Date   KNEE ARTHROSCOPY Right    Social History   Socioeconomic History   Marital status: Single    Spouse name: Not on file   Number of children: Not on file   Years of education: Not on file   Highest education  level: Not on file  Occupational History   Not on file  Tobacco Use   Smoking status: Never   Smokeless tobacco: Never  Substance and Sexual Activity   Alcohol use: Yes    Comment: socially   Drug use: Never   Sexual activity: Not on file  Other Topics Concern   Not on file  Social History Narrative   Not on file   Social Determinants of Health   Financial Resource Strain: Not on file  Food Insecurity: Not on file  Transportation Needs: Not on file  Physical Activity: Not on file  Stress: Not on file  Social Connections: Not on file  Intimate Partner Violence: Not on file   No family status information on file.   No family history on file. Allergies  Allergen Reactions   Bee Venom Swelling, Anaphylaxis, Hives, Itching, Rash and Shortness Of Breath    Patient Care Team: Cherlynn Polo as PCP - General (Physician Assistant)   Medications: Outpatient Medications Prior to Visit  Medication Sig   ACCU-CHEK GUIDE test strip Check blood glucose as directed up to 4 times a day.   Accu-Chek Softclix Lancets lancets Use as instructed check fasting blood glucose up  to 4 times daily.   blood glucose meter kit and supplies Dispense based on patient and insurance preference.Check blood glucose fasting q am.. (FOR ICD-10 E10.9, E11.9).   ciclopirox (PENLAC) 8 % solution Apply topically.   diclofenac Sodium (VOLTAREN) 1 % GEL Apply topically 4 (four) times daily.   econazole nitrate 1 % cream Apply topically.   EPINEPHrine 0.3 mg/0.3 mL IJ SOAJ injection Inject 0.3 mLs (0.3 mg total) into the muscle as needed for anaphylaxis.   losartan-hydrochlorothiazide (HYZAAR) 100-25 MG tablet Take 1 tablet by mouth daily.   metFORMIN (GLUCOPHAGE) 500 MG tablet Take 1 tablet (500 mg total) by mouth 2 (two) times daily with a meal.   VITAMIN D PO Take 1 capsule by mouth daily.   No facility-administered medications prior to visit.    Review of Systems Except see HPI  {Labs  Heme   Chem  Endocrine  Serology  Results Review (optional):23779}  Objective    BP 131/78 (BP Location: Left Arm, Patient Position: Sitting, Cuff Size: Large)   Pulse 90   Temp 98 F (36.7 C) (Temporal)   Resp 16   Wt (!) 336 lb (152.4 kg)   BMI 47.53 kg/m  {Show previous vital signs (optional):23777}   Physical Exam   No results found for any visits on 09/01/22.  Assessment & Plan    Routine Health Maintenance and Physical Exam  Exercise Activities and Dietary recommendations  Goals   None     Immunization History  Administered Date(s) Administered   Dtap, Unspecified 06/12/1990, 08/28/1990, 06/04/1991, 08/29/1994   HIB (PRP-OMP) 06/12/1990, 08/28/1990, 06/04/1991   Hepatitis B 02/13/2002, 03/19/2002, 08/13/2002   IPV 06/12/1990, 06/04/1991, 08/29/1994   MMR 06/04/1991, 08/29/1994   Tdap 08/27/2008, 09/03/2014    Health Maintenance  Topic Date Due   COVID-19 Vaccine (1) Never done   Flu Shot  12/07/2022   Hemoglobin A1C  12/21/2022   Complete foot exam   02/25/2023   Yearly kidney health urinalysis for diabetes  02/28/2023   Eye exam for diabetics  04/22/2023   Yearly kidney function blood test for diabetes  06/23/2023   DTaP/Tdap/Td vaccine (7 - Td or Tdap) 09/02/2024   Hepatitis C Screening: USPSTF Recommendation to screen - Ages 20-79 yo.  Completed   HIV Screening  Completed   HPV Vaccine  Aged Out    Discussed health benefits of physical activity, and encouraged him to engage in regular exercise appropriate for his age and condition.  ***  No follow-ups on file.     [facility]

## 2022-10-26 NOTE — Progress Notes (Deleted)
Vivien Rota Corran Lalone,acting as a Neurosurgeon for [employer], PA-C.,have documented all relevant documentation on the behalf of Debera Lat, PA-C,as directed by  [employer], PA-C while in the presence of [employer], PA-C.    Established patient visit   Patient: Robert Whitaker   DOB: 12-07-1990   32 y.o. Male  MRN: [id number] Visit Date: 10/27/2022  Today's healthcare provider: Debera Lat, PA-C   No chief complaint on file.  Subjective    HPI  Hypertension, follow-up  BP Readings from Last 3 Encounters:  09/01/22 131/78  07/21/22 119/78  07/04/22 (!) 144/85   Wt Readings from Last 3 Encounters:  09/01/22 (!) 336 lb (152.4 kg)  07/21/22 (!) 328 lb (148.8 kg)  07/04/22 (!) 332 lb 6.4 oz (150.8 kg)     He was last seen for hypertension 3 months ago.  BP at that visit was as above. Management since that visit includes none.  He reports {excellent/good/fair/poor:19665} compliance with treatment. He {is/is not:9024} having side effects. {document side effects if present:1} He is following a {diet:21022986} diet. He {is/is not:9024} exercising. He {does/does not:200015} smoke.  Use of agents associated with hypertension: {bp agents assoc with hypertension:511::"none"}.   Outside blood pressures are {***enter patient reported home BP readings, or 'not being checked':1}. Symptoms: {Yes/No:20286} chest pain {Yes/No:20286} chest pressure  {Yes/No:20286} palpitations {Yes/No:20286} syncope  {Yes/No:20286} dyspnea {Yes/No:20286} orthopnea  {Yes/No:20286} paroxysmal nocturnal dyspnea {Yes/No:20286} lower extremity edema   Pertinent labs Lab Results  Component Value Date   CHOL 119 02/27/2022   HDL 37 (L) 02/27/2022   LDLCALC 67 02/27/2022   TRIG 75 02/27/2022   CHOLHDL 3.2 02/27/2022   Lab Results  Component Value Date   NA 141 06/22/2022   K 4.7 06/22/2022   CREATININE 1.03 06/22/2022   EGFR 99 06/22/2022   GLUCOSE 138 (H) 06/22/2022   TSH 0.789 02/27/2022      The ASCVD Risk score (Arnett DK, et al., 2019) failed to calculate for the following reasons:   The 2019 ASCVD risk score is only valid for ages 38 to 76  --------------------------------------------------------------------------------------------------- Diabetes Mellitus Type II, Follow-up  Lab Results  Component Value Date   HGBA1C 6.6 (H) 06/22/2022   HGBA1C 6.8 (H) 02/08/2022   HGBA1C 6.4 (A) 03/01/2020   Wt Readings from Last 3 Encounters:  09/01/22 (!) 336 lb (152.4 kg)  07/21/22 (!) 328 lb (148.8 kg)  07/04/22 (!) 332 lb 6.4 oz (150.8 kg)   Last seen for diabetes 3 months ago.  Management since then includes increase in Metformin. He reports {excellent/good/fair/poor:19665} compliance with treatment. He {is/is not:21021397} having side effects. {document side effects if present:1} Symptoms: {Yes/No:20286} fatigue {Yes/No:20286} foot ulcerations  {Yes/No:20286} appetite changes {Yes/No:20286} nausea  {Yes/No:20286} paresthesia of the feet  {Yes/No:20286} polydipsia  {Yes/No:20286} polyuria {Yes/No:20286} visual disturbances   {Yes/No:20286} vomiting     Home blood sugar records: {diabetes glucometry results:16657}  Episodes of hypoglycemia? {Yes/No:20286} {enter symptoms and frequency of symptoms if yes:1}   Current insulin regiment: {enter 'none' or type of insulin and number of units taken with each dose of each insulin formulation that the patient is taking:1} Most Recent Eye Exam: *** {Current exercise:16438:::1} {Current diet habits:16563:::1}  Pertinent Labs: Lab Results  Component Value Date   CHOL 119 02/27/2022   HDL 37 (L) 02/27/2022   LDLCALC 67 02/27/2022   TRIG 75 02/27/2022   CHOLHDL 3.2 02/27/2022   Lab Results  Component Value Date   NA 141 06/22/2022   K  4.7 06/22/2022   CREATININE 1.03 06/22/2022   EGFR 99 06/22/2022   LABMICR 15.1 02/27/2022   MICRALBCREAT 6 02/27/2022      ---------------------------------------------------------------------------------------------------   Medications: Outpatient Medications Prior to Visit  Medication Sig   ACCU-CHEK GUIDE test strip Check blood glucose as directed up to 4 times a day.   Accu-Chek Softclix Lancets lancets Use as instructed check fasting blood glucose up to 4 times daily.   blood glucose meter kit and supplies Dispense based on patient and insurance preference.Check blood glucose fasting q am.. (FOR ICD-10 E10.9, E11.9).   ciclopirox (PENLAC) 8 % solution Apply topically.   diclofenac Sodium (VOLTAREN) 1 % GEL Apply topically 4 (four) times daily.   econazole nitrate 1 % cream Apply topically.   EPINEPHrine 0.3 mg/0.3 mL IJ SOAJ injection Inject 0.3 mLs (0.3 mg total) into the muscle as needed for anaphylaxis.   losartan-hydrochlorothiazide (HYZAAR) 100-25 MG tablet Take 1 tablet by mouth daily.   metFORMIN (GLUCOPHAGE) 500 MG tablet Take 1 tablet (500 mg total) by mouth 2 (two) times daily with a meal.   VITAMIN D PO Take 1 capsule by mouth daily.   No facility-administered medications prior to visit.    Review of Systems  {Labs  Heme  Chem  Endocrine  Serology  Results Review (optional):23779}   Objective    There were no vitals taken for this visit. {Show previous vital signs (optional):23777}  Physical Exam  ***  No results found for any visits on 10/27/22.  Assessment & Plan     ***  No follow-ups on file.      {provider attestation***:1}   Debera Lat, PA-C  [facility] [phone] (phone) [phone] (fax)  [facility]

## 2022-10-27 ENCOUNTER — Ambulatory Visit: Payer: BC Managed Care – PPO | Admitting: Physician Assistant

## 2022-11-19 NOTE — Progress Notes (Unsigned)
Established patient visit  Patient: Robert Whitaker   DOB: Oct 05, 1990   32 y.o. Male  MRN: [id number] Visit Date: 11/24/2022  Today's healthcare provider: Debera Lat, PA-C   Chief Complaint  Patient presents with   Medical Management of Chronic Issues   Subjective    HPI Diabetes mellitus without complication (HCC) Chronic and previously stable Last A1C was 6.6  in 2.2024 BP controlled Will check C-peptide if pt agrees Advised to increase a dose of metformin to 1000 mg for a better control under 6.5% Continue metformin 500-1000mg  daily Continue low carb diet and daily exercise - POCT urinalysis dipstick negative for glucose, protein/ renal impairment.   Morbid obesity (HCC) Chronic and gained a few pounds recently Will place a referral for weigh loss program if pt agrees Pt needs to check if his insurance will cover mon Advised  Weight loss of 5% of pt's current weight via healthy diet and daily exercise encouraged.    Return in about 3 months (around 12/01/2022).  All labs are normal and stable. A1c remains at 6.6% which is well controlled <7%. Please follow up with your PCP regarding your change in metformin dosing/usage and blood pressure start.   Please let us know if you have any questions.       11/24/2022    8:18 AM 09/01/2022    3:09 PM 07/21/2022    8:20 AM  Depression screen PHQ 2/9  Decreased Interest 0 0 0  Down, Depressed, Hopeless 0 0 0  PHQ - 2 Score 0 0 0  Altered sleeping 1 1 0  Tired, decreased energy 1 1 0  Change in appetite 1 0 0  Feeling bad or failure about yourself  0 0 0  Trouble concentrating 0 0 0  Moving slowly or fidgety/restless 0 0 0  Suicidal thoughts 0 0 0  PHQ-9 Score 3 2 0  Difficult doing work/chores Not difficult at all Not difficult at all Not difficult at all       No data to display          Medications: Outpatient Medications Prior to Visit  Medication Sig   ACCU-CHEK GUIDE test strip Check blood glucose as  directed up to 4 times a day.   Accu-Chek Softclix Lancets lancets Use as instructed check fasting blood glucose up to 4 times daily.   blood glucose meter kit and supplies Dispense based on patient and insurance preference.Check blood glucose fasting q am.. (FOR ICD-10 E10.9, E11.9).   losartan-hydrochlorothiazide (HYZAAR) 100-25 MG tablet Take 1 tablet by mouth daily.   metFORMIN (GLUCOPHAGE) 500 MG tablet Take 1 tablet (500 mg total) by mouth 2 (two) times daily with a meal.   VITAMIN D PO Take 1 capsule by mouth daily.   [DISCONTINUED] EPINEPHrine 0.3 mg/0.3 mL IJ SOAJ injection Inject 0.3 mLs (0.3 mg total) into the muscle as needed for anaphylaxis.   ciclopirox (PENLAC) 8 % solution Apply topically. (Patient not taking: Reported on 11/24/2022)   diclofenac Sodium (VOLTAREN) 1 % GEL Apply topically 4 (four) times daily. (Patient not taking: Reported on 11/24/2022)   econazole nitrate 1 % cream Apply topically. (Patient not taking: Reported on 11/24/2022)   No facility-administered medications prior to visit.    Review of Systems  All other systems reviewed and are negative.  Except see HPI   {Insert previous labs (optional):23779}  {See past labs  Heme  Chem  Endocrine  Serology  Results Review (optional):1}   Objective  BP 134/85   Pulse 85   Ht 5' 10.5" (1.791 m)   Wt (!) 329 lb 9.6 oz (149.5 kg)   SpO2 100%   BMI 46.62 kg/m  {Insert last BP/Wt (optional):23777}  {See vitals history (optional):1}  Physical Exam Vitals reviewed.  Constitutional:      General: He is not in acute distress.    Appearance: Normal appearance. He is not diaphoretic.  HENT:     Head: Normocephalic and atraumatic.  Eyes:     General: No scleral icterus.    Conjunctiva/sclera: Conjunctivae normal.  Cardiovascular:     Rate and Rhythm: Normal rate and regular rhythm.     Pulses: Normal pulses.     Heart sounds: Normal heart sounds. No murmur heard. Pulmonary:     Effort: Pulmonary  effort is normal. No respiratory distress.     Breath sounds: Normal breath sounds. No wheezing or rhonchi.  Musculoskeletal:     Cervical back: Neck supple.     Right lower leg: No edema.     Left lower leg: No edema.  Lymphadenopathy:     Cervical: No cervical adenopathy.  Skin:    General: Skin is warm and dry.     Findings: No rash.  Neurological:     Mental Status: He is alert and oriented to person, place, and time. Mental status is at baseline.  Psychiatric:        Mood and Affect: Mood normal.        Behavior: Behavior normal.      No results found for any visits on 11/24/22.  Assessment & Plan    1. Morbid obesity (HCC) Chronic and BMI 46.62 today Weight loss of 7 lb. Congratulated pt. Pt needs to proceed with strict low-carb, low-fat diet and daily exercise. Weight loss of 5% of pt's current weight via healthy diet and daily exercise encouraged.  Will reassess In 57mo - Amb Ref to Medical Weight Management  2. Diabetes mellitus without complication (HCC) Chronic and previously stable A1c in 06/2022 was 6.6 Continue metformin 500mg  bid - HgB A1c - Amb Ref to Medical Weight Management Advised weight loss, diet and exercise Fu in 57mo  3. Hypertension associated with diabetes (HCC) Chronic and stable  Continue hyzaar 100-25mg  daily. Low salt diet and exercise advised Fu in 3 mo  4. Bee allergy status In the past, expired epi pen , requested Rx - EPINEPHrine 0.3 mg/0.3 mL IJ SOAJ injection; Inject 0.3 mg into the muscle as needed for anaphylaxis.  Dispense: 1 each; Refill: 1 Will refer to allergy if symptoms worsen  Return in about 3 months (around 02/24/2023) for chronic disease f/u.     The patient was advised to call back or seek an in-person evaluation if the symptoms worsen or if the condition fails to improve as anticipated.  I discussed the assessment and treatment plan with the patient. The patient was provided an opportunity to ask questions and all  were answered. The patient agreed with the plan and demonstrated an understanding of the instructions.  I, Debera Lat, PA-C have reviewed all documentation for this visit. The documentation on 11/24/22 for the exam, diagnosis, procedures, and orders are all accurate and complete.  Debera Lat, [facility], MMS [facility] [phone] (phone) [phone] (fax)  [facility]

## 2022-11-24 ENCOUNTER — Ambulatory Visit (INDEPENDENT_AMBULATORY_CARE_PROVIDER_SITE_OTHER): Payer: BC Managed Care – PPO | Admitting: Physician Assistant

## 2022-11-24 ENCOUNTER — Encounter: Payer: Self-pay | Admitting: Physician Assistant

## 2022-11-24 VITALS — BP 134/85 | HR 85 | Ht 70.5 in | Wt 329.6 lb

## 2022-11-24 DIAGNOSIS — Z91030 Bee allergy status: Secondary | ICD-10-CM | POA: Diagnosis not present

## 2022-11-24 DIAGNOSIS — E119 Type 2 diabetes mellitus without complications: Secondary | ICD-10-CM

## 2022-11-24 DIAGNOSIS — E1159 Type 2 diabetes mellitus with other circulatory complications: Secondary | ICD-10-CM

## 2022-11-24 DIAGNOSIS — I152 Hypertension secondary to endocrine disorders: Secondary | ICD-10-CM

## 2022-11-24 MED ORDER — EPINEPHRINE 0.3 MG/0.3ML IJ SOAJ: 0.3000 mg | INTRAMUSCULAR | 1 refills | Status: AC | PRN

## 2022-11-25 LAB — HEMOGLOBIN A1C
Est. average glucose Bld gHb Est-mCnc: 134 mg/dL
Hgb A1c MFr Bld: 6.3 % — ABNORMAL HIGH (ref 4.8–5.6)

## 2022-12-08 ENCOUNTER — Ambulatory Visit: Payer: BC Managed Care – PPO | Admitting: Physician Assistant

## 2023-02-22 ENCOUNTER — Other Ambulatory Visit: Payer: Self-pay | Admitting: Family Medicine

## 2023-02-22 DIAGNOSIS — E1159 Type 2 diabetes mellitus with other circulatory complications: Secondary | ICD-10-CM

## 2023-02-22 NOTE — Telephone Encounter (Signed)
Requested Prescriptions  Pending Prescriptions Disp Refills   losartan-hydrochlorothiazide (HYZAAR) 100-25 MG tablet [Pharmacy Med Name: LOSARTAN-HCTZ 100-25 MG TAB] 90 tablet 0    Sig: TAKE 1 TABLET BY MOUTH EVERY DAY     Cardiovascular: ARB + Diuretic Combos Failed - 02/22/2023  1:30 AM      Failed - K in normal range and within 180 days    Potassium  Date Value Ref Range Status  06/22/2022 4.7 3.5 - 5.2 mmol/L Final         Failed - Na in normal range and within 180 days    Sodium  Date Value Ref Range Status  06/22/2022 141 134 - 144 mmol/L Final         Failed - Cr in normal range and within 180 days    Creatinine, Ser  Date Value Ref Range Status  06/22/2022 1.03 0.76 - 1.27 mg/dL Final         Failed - eGFR is 10 or above and within 180 days    GFR calc Af Amer  Date Value Ref Range Status  09/19/2019 93 >59 mL/min/1.73 Final    Comment:    **[facility] currently reports eGFR in compliance with the current**   recommendations of the [employer]. [facility] will   update reporting as new guidelines are published from the NKF-ASN   Task force.    GFR calc non Af Amer  Date Value Ref Range Status  09/19/2019 80 >59 mL/min/1.73 Final   eGFR  Date Value Ref Range Status  06/22/2022 99 >59 mL/min/1.73 Final         Passed - Patient is not pregnant      Passed - Last BP in normal range    BP Readings from Last 1 Encounters:  11/24/22 134/85         Passed - Valid encounter within last 6 months    Recent Outpatient Visits           3 months ago Diabetes mellitus without complication [facility])   [place] [facility] [place], [place], PA-C   5 months ago Annual physical exam   [place] [facility] [place], [place], PA-C   7 months ago Morbid obesity [facility])   [place] [facility] [place], [place], PA-C   7 months ago Left ear pain   [place] [facility] [place], [place], PA-C   8 months ago  Penile discharge   [facility] Jacky Kindle, FNP       Future Appointments             In 1 week Debera Lat, PA-C [facility], [place]

## 2023-03-02 ENCOUNTER — Ambulatory Visit: Payer: BC Managed Care – PPO | Admitting: Physician Assistant

## 2023-03-16 NOTE — Patient Instructions (Signed)
Patient is due for diabetic foot exam   Recommend Covid vaccine at pharmacy

## 2023-03-18 NOTE — Progress Notes (Unsigned)
Established patient visit  Patient: Robert Whitaker   DOB: 11-23-1990   32 y.o. Male  MRN: [id number] Visit Date: 03/19/2023  Today's healthcare provider: Debera Lat, PA-C   No chief complaint on file.  Subjective    HPI  *** Discussed the use of AI scribe software for clinical note transcription with the patient, who gave verbal consent to proceed.  History of Present Illness               11/24/2022    8:18 AM 09/01/2022    3:09 PM 07/21/2022    8:20 AM  Depression screen PHQ 2/9  Decreased Interest 0 0 0  Down, Depressed, Hopeless 0 0 0  PHQ - 2 Score 0 0 0  Altered sleeping 1 1 0  Tired, decreased energy 1 1 0  Change in appetite 1 0 0  Feeling bad or failure about yourself  0 0 0  Trouble concentrating 0 0 0  Moving slowly or fidgety/restless 0 0 0  Suicidal thoughts 0 0 0  PHQ-9 Score 3 2 0  Difficult doing work/chores Not difficult at all Not difficult at all Not difficult at all       No data to display          Medications: Outpatient Medications Prior to Visit  Medication Sig   ACCU-CHEK GUIDE test strip Check blood glucose as directed up to 4 times a day.   Accu-Chek Softclix Lancets lancets Use as instructed check fasting blood glucose up to 4 times daily.   blood glucose meter kit and supplies Dispense based on patient and insurance preference.Check blood glucose fasting q am.. (FOR ICD-10 E10.9, E11.9).   ciclopirox (PENLAC) 8 % solution Apply topically. (Patient not taking: Reported on 11/24/2022)   diclofenac Sodium (VOLTAREN) 1 % GEL Apply topically 4 (four) times daily. (Patient not taking: Reported on 11/24/2022)   econazole nitrate 1 % cream Apply topically. (Patient not taking: Reported on 11/24/2022)   EPINEPHrine 0.3 mg/0.3 mL IJ SOAJ injection Inject 0.3 mg into the muscle as needed for anaphylaxis.   losartan-hydrochlorothiazide (HYZAAR) 100-25 MG tablet TAKE 1 TABLET BY MOUTH EVERY DAY   metFORMIN (GLUCOPHAGE) 500 MG tablet Take 1  tablet (500 mg total) by mouth 2 (two) times daily with a meal.   VITAMIN D PO Take 1 capsule by mouth daily.   No facility-administered medications prior to visit.    Review of Systems  All other systems reviewed and are negative.  Except see HPI   {Insert previous labs (optional):23779} {See past labs  Heme  Chem  Endocrine  Serology  Results Review (optional):1}   Objective    There were no vitals taken for this visit. {Insert last BP/Wt (optional):23777}{See vitals history (optional):1}   Physical Exam Vitals reviewed.  Constitutional:      General: He is not in acute distress.    Appearance: Normal appearance. He is not diaphoretic.  HENT:     Head: Normocephalic and atraumatic.  Eyes:     General: No scleral icterus.    Conjunctiva/sclera: Conjunctivae normal.  Cardiovascular:     Rate and Rhythm: Normal rate and regular rhythm.     Pulses: Normal pulses.     Heart sounds: Normal heart sounds. No murmur heard. Pulmonary:     Effort: Pulmonary effort is normal. No respiratory distress.     Breath sounds: Normal breath sounds. No wheezing or rhonchi.  Musculoskeletal:     Cervical back: Neck supple.  Right lower leg: No edema.     Left lower leg: No edema.  Lymphadenopathy:     Cervical: No cervical adenopathy.  Skin:    General: Skin is warm and dry.     Findings: No rash.  Neurological:     Mental Status: He is alert and oriented to person, place, and time. Mental status is at baseline.  Psychiatric:        Mood and Affect: Mood normal.        Behavior: Behavior normal.      No results found for any visits on 03/19/23.  Assessment & Plan       No follow-ups on file.     The patient was advised to call back or seek an in-person evaluation if the symptoms worsen or if the condition fails to improve as anticipated.  I discussed the assessment and treatment plan with the patient. The patient was provided an opportunity to ask questions and all  were answered. The patient agreed with the plan and demonstrated an understanding of the instructions. 03/19/23  for the exam, diagnosis, procedures, and orders are all accurate and complete.  Debera Lat, [facility], MMS [facility] [phone] (phone) [phone] (fax)  [facility]

## 2023-03-19 ENCOUNTER — Ambulatory Visit (INDEPENDENT_AMBULATORY_CARE_PROVIDER_SITE_OTHER): Payer: BC Managed Care – PPO | Admitting: Physician Assistant

## 2023-03-19 ENCOUNTER — Encounter: Payer: Self-pay | Admitting: Physician Assistant

## 2023-03-19 VITALS — BP 122/84 | HR 88 | Resp 16 | Ht 70.5 in | Wt 343.2 lb

## 2023-03-19 DIAGNOSIS — E786 Lipoprotein deficiency: Secondary | ICD-10-CM

## 2023-03-19 DIAGNOSIS — I152 Hypertension secondary to endocrine disorders: Secondary | ICD-10-CM

## 2023-03-19 DIAGNOSIS — E1159 Type 2 diabetes mellitus with other circulatory complications: Secondary | ICD-10-CM

## 2023-03-19 DIAGNOSIS — Z7984 Long term (current) use of oral hypoglycemic drugs: Secondary | ICD-10-CM

## 2023-03-19 DIAGNOSIS — B353 Tinea pedis: Secondary | ICD-10-CM | POA: Diagnosis not present

## 2023-03-19 DIAGNOSIS — E119 Type 2 diabetes mellitus without complications: Secondary | ICD-10-CM | POA: Diagnosis not present

## 2023-03-19 MED ORDER — METFORMIN HCL 500 MG PO TABS: 500.0000 mg | ORAL_TABLET | Freq: Two times a day (BID) | ORAL | 3 refills | Status: AC

## 2023-03-20 LAB — LIPID PANEL
Chol/HDL Ratio: 3.4 ratio (ref 0.0–5.0)
Cholesterol, Total: 143 mg/dL (ref 100–199)
HDL: 42 mg/dL (ref >39)
LDL Chol Calc (NIH): 86 mg/dL (ref 0–99)
Triglycerides: 79 mg/dL (ref 0–149)
VLDL Cholesterol Cal: 15 mg/dL (ref 5–40)

## 2023-03-21 LAB — BASIC METABOLIC PANEL

## 2023-03-21 LAB — MICROALBUMIN / CREATININE URINE RATIO
Creatinine, Urine: 120.9 mg/dL
Microalb/Creat Ratio: 5 mg/g{creat} (ref 0–29)
Microalbumin, Urine: 6 ug/mL

## 2023-05-13 DIAGNOSIS — Z7251 High risk heterosexual behavior: Secondary | ICD-10-CM | POA: Diagnosis not present

## 2023-05-13 DIAGNOSIS — R300 Dysuria: Secondary | ICD-10-CM | POA: Diagnosis not present

## 2023-05-13 DIAGNOSIS — I10 Essential (primary) hypertension: Secondary | ICD-10-CM | POA: Diagnosis not present

## 2023-05-13 DIAGNOSIS — N029 Recurrent and persistent hematuria with unspecified morphologic changes: Secondary | ICD-10-CM | POA: Diagnosis not present

## 2023-05-16 DIAGNOSIS — I10 Essential (primary) hypertension: Secondary | ICD-10-CM | POA: Diagnosis not present

## 2023-05-16 DIAGNOSIS — R300 Dysuria: Secondary | ICD-10-CM | POA: Diagnosis not present

## 2023-05-16 DIAGNOSIS — R319 Hematuria, unspecified: Secondary | ICD-10-CM | POA: Diagnosis not present

## 2023-05-16 DIAGNOSIS — N029 Recurrent and persistent hematuria with unspecified morphologic changes: Secondary | ICD-10-CM | POA: Diagnosis not present

## 2023-05-22 ENCOUNTER — Encounter: Payer: Self-pay | Admitting: Physician Assistant

## 2023-05-24 ENCOUNTER — Encounter: Payer: BC Managed Care – PPO | Admitting: Urology

## 2023-06-05 ENCOUNTER — Encounter: Payer: BC Managed Care – PPO | Admitting: Urology

## 2023-06-05 ENCOUNTER — Encounter: Payer: Self-pay | Admitting: Urology

## 2023-06-05 ENCOUNTER — Ambulatory Visit (INDEPENDENT_AMBULATORY_CARE_PROVIDER_SITE_OTHER): Payer: BC Managed Care – PPO | Admitting: Urology

## 2023-06-05 VITALS — BP 151/82 | HR 109 | Ht 71.0 in | Wt 342.0 lb

## 2023-06-05 DIAGNOSIS — R310 Gross hematuria: Secondary | ICD-10-CM | POA: Diagnosis not present

## 2023-06-05 LAB — URINALYSIS, ROUTINE W REFLEX MICROSCOPIC
Bilirubin, UA: NEGATIVE
Glucose, UA: NEGATIVE
Ketones, UA: NEGATIVE
Leukocytes,UA: NEGATIVE
Nitrite, UA: NEGATIVE
RBC, UA: NEGATIVE
Specific Gravity, UA: 1.02 (ref 1.005–1.030)
Urobilinogen, Ur: 0.2 mg/dL (ref 0.2–1.0)
pH, UA: 7 (ref 5.0–7.5)

## 2023-06-05 NOTE — Progress Notes (Signed)
Assessment: 1. Gross hematuria     Plan: I personally reviewed the patient's chart including provider notes, and lab results. Today I had a discussion with the patient regarding the findings of gross hematuria including the implications and differential diagnoses associated with it.  I also discussed recommendations for further evaluation including the rationale for upper tract imaging and cystoscopy.  I discussed the nature of these procedures including potential risk and complications.  The patient expressed an understanding of these issues. CT hematuria study Return to office in 1 month   Chief Complaint:  Chief Complaint  Patient presents with   Hematuria    History of Present Illness:  Robert Whitaker is a 33 y.o. male who is seen for evaluation of gross hematuria. He reports a history of intermittent hematuria on 3 separate occasions.  The first occasion was approximately 1 year ago.  The most recent episode occurred on 05/13/2023.  He noted blood in the toilet while voiding.  He also had some dysuria.  This lasted 1 void and his symptoms resolved.  He does associate these episodes with straining with a bowel movement.  He does not have lower urinary tract symptoms. He was evaluated at urgent care on 05/13/2023 for gross hematuria and dysuria. Dipstick urinalysis showed small blood. Urine culture was negative.  STD testing negative. No history of kidney stones or UTIs. IPSS = 1 today.  Past Medical History:  Past Medical History:  Diagnosis Date   Diabetes mellitus without complication (HCC)     Past Surgical History:  Past Surgical History:  Procedure Laterality Date   KNEE ARTHROSCOPY Right     Allergies:  Allergies  Allergen Reactions   Bee Venom Swelling, Anaphylaxis, Hives, Itching, Rash and Shortness Of Breath    Family History:  No family history on file.  Social History:  Social History   Tobacco Use   Smoking status: Never   Smokeless tobacco: Never   Substance Use Topics   Alcohol use: Yes    Comment: socially   Drug use: Never    Review of symptoms:  Constitutional:  Negative for unexplained weight loss, night sweats, fever, chills ENT:  Negative for nose bleeds, sinus pain, painful swallowing CV:  Negative for chest pain, shortness of breath, exercise intolerance, palpitations, loss of consciousness Resp:  Negative for cough, wheezing, shortness of breath GI:  Negative for nausea, vomiting, diarrhea, bloody stools GU:  Positives noted in HPI; otherwise negative for gross hematuria, dysuria, urinary incontinence Neuro:  Negative for seizures, poor balance, limb weakness, slurred speech Psych:  Negative for lack of energy, depression, anxiety Endocrine:  Negative for polydipsia, polyuria, symptoms of hypoglycemia (dizziness, hunger, sweating) Hematologic:  Negative for anemia, purpura, petechia, prolonged or excessive bleeding, use of anticoagulants  Allergic:  Negative for difficulty breathing or choking as a result of exposure to anything; no shellfish allergy; no allergic response (rash/itch) to materials, foods  Physical exam: BP (!) 151/82   Pulse (!) 109   Ht 5\' 11"  (1.803 m)   Wt (!) 342 lb (155.1 kg)   BMI 47.70 kg/m  GENERAL APPEARANCE:  Well appearing, well developed, well nourished, NAD HEENT: Atraumatic, Normocephalic, oropharynx clear. NECK: Supple without lymphadenopathy or thyromegaly. LUNGS: Clear to auscultation bilaterally. HEART: Regular Rate and Rhythm without murmurs, gallops, or rubs. ABDOMEN: Soft, non-tender, No Masses. EXTREMITIES: Moves all extremities well.  Without clubbing, cyanosis, or edema. NEUROLOGIC:  Alert and oriented x 3, normal gait, CN II-XII grossly intact.  MENTAL STATUS:  Appropriate. BACK:  Non-tender to palpation.  No CVAT SKIN:  Warm, dry and intact.   GU: Penis:  uncircumcised Meatus: Normal Scrotum: normal, no masses Testis: normal without masses bilateral Epididymis:  normal   Results: U/A: negative

## 2023-06-06 NOTE — Addendum Note (Signed)
Addended by: Lizbeth Bark on: 06/06/2023 01:42 PM   Modules accepted: Orders

## 2023-06-22 ENCOUNTER — Ambulatory Visit: Payer: Self-pay | Admitting: Physician Assistant

## 2023-06-22 ENCOUNTER — Ambulatory Visit
Admission: RE | Admit: 2023-06-22 | Discharge: 2023-06-22 | Disposition: A | Discharge status: Home / Self Care | Payer: BC Managed Care – PPO | Source: Ambulatory Visit | Attending: Urology | Admitting: Urology

## 2023-06-22 DIAGNOSIS — R310 Gross hematuria: Secondary | ICD-10-CM

## 2023-06-22 MED ORDER — IOPAMIDOL (ISOVUE-370) INJECTION 76%
100.0000 mL | Freq: Once | INTRAVENOUS | Status: AC | PRN
  Administered 2023-06-22: 100 mL via INTRAVENOUS

## 2023-06-22 MED ORDER — IOPAMIDOL (ISOVUE-370) INJECTION 76%
125.0000 mL | Freq: Once | INTRAVENOUS | Status: DC | PRN
Start: 2023-06-22 — End: 2023-06-23

## 2023-07-06 ENCOUNTER — Encounter: Payer: Self-pay | Admitting: Urology

## 2023-07-06 ENCOUNTER — Ambulatory Visit: Payer: BC Managed Care – PPO | Admitting: Urology

## 2023-07-06 VITALS — BP 156/91 | HR 88 | Ht 71.0 in | Wt 340.0 lb

## 2023-07-06 DIAGNOSIS — R310 Gross hematuria: Secondary | ICD-10-CM | POA: Diagnosis not present

## 2023-07-06 LAB — URINALYSIS
Bilirubin, UA: NEGATIVE
Glucose, UA: NEGATIVE
Ketones, UA: NEGATIVE
Leukocytes,UA: NEGATIVE
Nitrite, UA: NEGATIVE
Protein,UA: NEGATIVE
Specific Gravity, UA: 1.025 (ref 1.005–1.030)
Urobilinogen, Ur: 0.2 mg/dL (ref 0.2–1.0)
pH, UA: 5.5 (ref 5.0–7.5)

## 2023-07-06 NOTE — Progress Notes (Signed)
   Assessment: 1. Gross hematuria     Plan: I personally reviewed the CT study from 06/22/2023 with results as noted below.  Results discussed with the patient. CX bladder detect sent today.  Will contact him with results. Return to office in 6 months.  Chief Complaint:  Chief Complaint  Patient presents with   Hematuria    History of Present Illness:  Robert Whitaker is a 33 y.o. male who is seen for further evaluation of gross hematuria. He reported a history of intermittent hematuria on 3 separate occasions.  The first occasion was approximately 1 year ago.  The most recent episode occurred on 05/13/2023.  He noted blood in the toilet while voiding.  He also had some dysuria.  This lasted 1 void and his symptoms resolved.  He associated these episodes with straining with a bowel movement.  No lower urinary tract symptoms. He was evaluated at urgent care on 05/13/2023 for gross hematuria and dysuria. Dipstick urinalysis showed small blood. Urine culture was negative.  STD testing negative. No history of kidney stones or UTIs. IPSS = 1. Urinalysis from 1/25 was negative for blood. CT hematuria protocol obtained on 06/22/2023 showed no renal or ureteral calculi, no renal masses, no obstruction or filling defect.  He returns today for follow-up.  He has not had any further episodes of gross hematuria.  No dysuria or flank pain.  Portions of the above documentation were copied from a prior visit for review purposes only.   Past Medical History:  Past Medical History:  Diagnosis Date   Diabetes mellitus without complication (HCC)     Past Surgical History:  Past Surgical History:  Procedure Laterality Date   KNEE ARTHROSCOPY Right     Allergies:  Allergies  Allergen Reactions   Bee Venom Swelling, Anaphylaxis, Hives, Itching, Rash and Shortness Of Breath    Family History:  No family history on file.  Social History:  Social History   Tobacco Use   Smoking status:  Never   Smokeless tobacco: Never  Substance Use Topics   Alcohol use: Yes    Comment: socially   Drug use: Never    ROS: Constitutional:  Negative for fever, chills, weight loss CV: Negative for chest pain, previous MI, hypertension Respiratory:  Negative for shortness of breath, wheezing, sleep apnea, frequent cough GI:  Negative for nausea, vomiting, bloody stool, GERD  Physical exam: BP (!) 156/91   Pulse 88   Ht 5\' 11"  (1.803 m)   Wt (!) 340 lb (154.2 kg)   BMI 47.42 kg/m  GENERAL APPEARANCE:  Well appearing, well developed, well nourished, NAD HEENT:  Atraumatic, normocephalic, oropharynx clear NECK:  Supple without lymphadenopathy or thyromegaly ABDOMEN:  Soft, non-tender, no masses EXTREMITIES:  Moves all extremities well, without clubbing, cyanosis, or edema NEUROLOGIC:  Alert and oriented x 3, normal gait, CN II-XII grossly intact MENTAL STATUS:  appropriate BACK:  Non-tender to palpation, No CVAT SKIN:  Warm, dry, and intact   Results: U/A: 0-2 RBC, 0-5 WBC

## 2023-07-17 ENCOUNTER — Encounter: Payer: Self-pay | Admitting: Urology

## 2023-07-20 ENCOUNTER — Ambulatory Visit: Payer: BC Managed Care – PPO | Admitting: Physician Assistant

## 2023-07-23 ENCOUNTER — Encounter: Payer: Self-pay | Admitting: Urology

## 2023-08-17 ENCOUNTER — Ambulatory Visit: Admitting: Physician Assistant

## 2023-09-24 ENCOUNTER — Ambulatory Visit
Admission: EM | Admit: 2023-09-24 | Discharge: 2023-09-24 | Disposition: A | Discharge status: Home / Self Care | Attending: Family Medicine | Admitting: Family Medicine

## 2023-09-24 DIAGNOSIS — R110 Nausea: Secondary | ICD-10-CM | POA: Diagnosis not present

## 2023-09-24 DIAGNOSIS — R509 Fever, unspecified: Secondary | ICD-10-CM

## 2023-09-24 DIAGNOSIS — R000 Tachycardia, unspecified: Secondary | ICD-10-CM

## 2023-09-24 DIAGNOSIS — R1084 Generalized abdominal pain: Secondary | ICD-10-CM | POA: Diagnosis not present

## 2023-09-24 LAB — POC COVID19/FLU A&B COMBO
Covid Antigen, POC: NEGATIVE
Influenza A Antigen, POC: NEGATIVE
Influenza B Antigen, POC: NEGATIVE

## 2023-09-24 MED ORDER — ONDANSETRON 4 MG PO TBDP: 4.0000 mg | ORAL_TABLET | Freq: Three times a day (TID) | ORAL | 0 refills | Status: AC | PRN

## 2023-09-24 MED ORDER — ACETAMINOPHEN 325 MG PO TABS
975.0000 mg | ORAL_TABLET | Freq: Once | ORAL | Status: AC
  Administered 2023-09-24: 975 mg via ORAL

## 2023-09-24 NOTE — ED Triage Notes (Signed)
 Diarrhea, fever, abdominal pain, body aches, nausea x 1 day.   Pt states he saw blood when having a bowel movement today.

## 2023-09-24 NOTE — Discharge Instructions (Signed)
 Take your nausea medication as needed for the nausea control and to encourage drinking fluids.  Ibuprofen and Tylenol  as needed for fever control.  Follow-up for significantly worsening symptoms at any time.

## 2023-09-27 NOTE — ED Provider Notes (Signed)
 [facility]    CSN: [id number] Arrival date & time: 09/24/23  1752      History   Chief Complaint Chief Complaint  Patient presents with   Abdominal Pain   Diarrhea    HPI Robert Whitaker is a 33 y.o. male.   Patient presenting today with 1 day history of fever, body aches, nausea, diarrhea, abdominal pain, fatigue.  She may have seen some blood with a bowel movement this morning but none since.  Denies new foods or medications, known chronic GI issues, recent sick contacts, travel outside the country recently.  So far not trying anything over-the-counter for symptoms.    Past Medical History:  Diagnosis Date   Diabetes mellitus without complication [facility])     Patient Active Problem List   Diagnosis Date Noted   Gross hematuria 06/22/2022   Excessive cerumen in both ear canals 06/22/2022   Hypertension associated with diabetes (HCC) 06/22/2022   Penile discharge 06/22/2022   Type 2 diabetes mellitus with hyperglycemia, with long-term current use of insulin (HCC) 06/22/2022   Difficulty walking 03/06/2022   Gout 03/06/2022   Acquired hallux rigidus of left foot 02/07/2022   Acquired pes planus of left foot 02/06/2022   Swelling of left foot 02/06/2022   Low HDL (under 40) 10/21/2019   Bee allergy status 09/05/2019   Screening for STD (sexually transmitted disease) 09/05/2019   Body mass index (BMI) of 45.0-49.9 in adult (HCC) 09/05/2019   Left knee pain 09/05/2019   Morbid obesity (HCC) 09/05/2019   Elevated hemoglobin A1c 09/04/2014    Past Surgical History:  Procedure Laterality Date   KNEE ARTHROSCOPY Right        Home Medications    Prior to Admission medications   Medication Sig Start Date End Date Taking? Authorizing Provider  blood glucose meter kit and supplies Dispense based on patient and insurance preference.Check blood glucose fasting q am.. (FOR ICD-10 E10.9, E11.9). 10/21/19  Yes Flinchum, Charlton Cooler, FNP   losartan -hydrochlorothiazide (HYZAAR) 100-25 MG tablet TAKE 1 TABLET BY MOUTH EVERY DAY 02/22/23  Yes Normie Becton, FNP  metFORMIN  (GLUCOPHAGE ) 500 MG tablet Take 1 tablet (500 mg total) by mouth 2 (two) times daily with a meal. 03/19/23  Yes Ostwalt, Janna, PA-C  ondansetron  (ZOFRAN -ODT) 4 MG disintegrating tablet Take 1 tablet (4 mg total) by mouth every 8 (eight) hours as needed for nausea or vomiting. 09/24/23  Yes Corbin Dess, PA-C  VITAMIN D PO Take 1 capsule by mouth daily.   Yes [provider]  ACCU-CHEK GUIDE test strip Check blood glucose as directed up to 4 times a day. 03/01/20   Flinchum, Charlton Cooler, FNP  Accu-Chek Softclix Lancets lancets Use as instructed check fasting blood glucose up to 4 times daily. 03/01/20   Flinchum, Charlton Cooler, FNP  ciclopirox (PENLAC) 8 % solution Apply topically. 03/06/22   [provider]  diclofenac Sodium (VOLTAREN) 1 % GEL Apply topically 4 (four) times daily.    [provider]  econazole nitrate 1 % cream Apply topically. 03/06/22   [provider]  EPINEPHrine  0.3 mg/0.3 mL IJ SOAJ injection Inject 0.3 mg into the muscle as needed for anaphylaxis. 11/24/22   Ostwalt, Janna, PA-C    Family History History reviewed. No pertinent family history.  Social History Social History   Tobacco Use   Smoking status: Never   Smokeless tobacco: Never  Vaping Use   Vaping status: Never Used  Substance Use Topics  Alcohol use: Yes    Comment: socially   Drug use: Never     Allergies   Bee venom   Review of Systems Review of Systems Per HPI  Physical Exam Triage Vital Signs ED Triage Vitals  Encounter Vitals Group     BP 09/24/23 1855 129/78     Systolic BP Percentile --      Diastolic BP Percentile --      Pulse Rate 09/24/23 1855 (!) 113     Resp 09/24/23 1855 16     Temp 09/24/23 1852 (!) 100.4 F (38 C)     Temp Source 09/24/23 1852 Oral     SpO2 09/24/23 1855 94 %     Weight --       Height --      Head Circumference --      Peak Flow --      Pain Score 09/24/23 1855 6     Pain Loc --      Pain Education --      Exclude from Growth Chart --    No data found.  Updated Vital Signs BP 129/78 (BP Location: Right Arm)   Pulse (!) 113   Temp (!) 100.4 F (38 C) (Oral)   Resp 16   SpO2 94%   Visual Acuity Right Eye Distance:   Left Eye Distance:   Bilateral Distance:    Right Eye Near:   Left Eye Near:    Bilateral Near:     Physical Exam Vitals and nursing note reviewed.  Constitutional:      Appearance: Normal appearance.  HENT:     Head: Atraumatic.     Mouth/Throat:     Mouth: Mucous membranes are moist.  Eyes:     Extraocular Movements: Extraocular movements intact.     Conjunctiva/sclera: Conjunctivae normal.  Cardiovascular:     Rate and Rhythm: Normal rate and regular rhythm.  Pulmonary:     Effort: Pulmonary effort is normal.     Breath sounds: Normal breath sounds.  Abdominal:     General: Bowel sounds are normal. There is no distension.     Palpations: Abdomen is soft.     Tenderness: There is no abdominal tenderness. There is no right CVA tenderness, left CVA tenderness or guarding.  Musculoskeletal:        General: Normal range of motion.     Cervical back: Normal range of motion and neck supple.  Skin:    General: Skin is warm and dry.  Neurological:     General: No focal deficit present.     Mental Status: He is oriented to person, place, and time.  Psychiatric:        Mood and Affect: Mood normal.        Thought Content: Thought content normal.        Judgment: Judgment normal.      UC Treatments / Results  Labs (all labs ordered are listed, but only abnormal results are displayed) Labs Reviewed  POC COVID19/FLU A&B COMBO - Normal    EKG   Radiology No results found.  Procedures Procedures (including critical care time)  Medications Ordered in UC Medications  acetaminophen  (TYLENOL ) tablet 975 mg (975  mg Oral Given 09/24/23 1907)    Initial Impression / Assessment and Plan / UC Course  I have reviewed the triage vital signs and the nursing notes.  Pertinent labs & imaging results that were available during my care of the patient were reviewed  by me and considered in my medical decision making (see chart for details).     Febrile tachycardic in triage, Tylenol  given for this at that time.  Rapid flu and COVID-negative.  Suspect viral GI illness causing symptoms.  No red flag findings on exam and he is well-appearing in no acute distress.  Treat with Zofran , brat diet, fluids, rest, over-the-counter pain relievers as needed.  Return for worsening symptoms.  Final Clinical Impressions(s) / UC Diagnoses   Final diagnoses:  Nausea without vomiting  Generalized abdominal pain  Fever, unspecified  Tachycardia     Discharge Instructions      Take your nausea medication as needed for the nausea control and to encourage drinking fluids.  Ibuprofen and Tylenol  as needed for fever control.  Follow-up for significantly worsening symptoms at any time.  ED Prescriptions     Medication Sig Dispense Auth. Provider   ondansetron  (ZOFRAN -ODT) 4 MG disintegrating tablet Take 1 tablet (4 mg total) by mouth every 8 (eight) hours as needed for nausea or vomiting. 20 tablet Corbin Dess, [place]      PDMP not reviewed this encounter.   Corbin Dess, [place] 09/27/23 1615

## 2023-09-28 ENCOUNTER — Ambulatory Visit: Admitting: Physician Assistant

## 2023-09-28 ENCOUNTER — Other Ambulatory Visit (HOSPITAL_COMMUNITY)
Admission: RE | Admit: 2023-09-28 | Discharge: 2023-09-28 | Disposition: A | Discharge status: Home / Self Care | Source: Ambulatory Visit | Attending: Physician Assistant | Admitting: Physician Assistant

## 2023-09-28 ENCOUNTER — Encounter: Payer: Self-pay | Admitting: Physician Assistant

## 2023-09-28 DIAGNOSIS — Z202 Contact with and (suspected) exposure to infections with a predominantly sexual mode of transmission: Secondary | ICD-10-CM | POA: Diagnosis not present

## 2023-09-28 DIAGNOSIS — I152 Hypertension secondary to endocrine disorders: Secondary | ICD-10-CM | POA: Diagnosis not present

## 2023-09-28 DIAGNOSIS — Z6841 Body Mass Index (BMI) 40.0 and over, adult: Secondary | ICD-10-CM | POA: Diagnosis not present

## 2023-09-28 DIAGNOSIS — E1159 Type 2 diabetes mellitus with other circulatory complications: Secondary | ICD-10-CM

## 2023-09-28 DIAGNOSIS — E119 Type 2 diabetes mellitus without complications: Secondary | ICD-10-CM

## 2023-09-29 LAB — HEMOGLOBIN A1C
Est. average glucose Bld gHb Est-mCnc: 148 mg/dL
Hgb A1c MFr Bld: 6.8 % — ABNORMAL HIGH (ref 4.8–5.6)

## 2023-09-29 LAB — BASIC METABOLIC PANEL WITH GFR
BUN/Creatinine Ratio: 13 (ref 9–20)
BUN: 14 mg/dL (ref 6–20)
CO2: 24 mmol/L (ref 20–29)
Calcium: 9.7 mg/dL (ref 8.7–10.2)
Chloride: 99 mmol/L (ref 96–106)
Creatinine, Ser: 1.07 mg/dL (ref 0.76–1.27)
Glucose: 92 mg/dL (ref 70–99)
Potassium: 3.7 mmol/L (ref 3.5–5.2)
Sodium: 139 mmol/L (ref 134–144)
eGFR: 94 mL/min/{1.73_m2} (ref >59)

## 2023-09-29 LAB — LIPID PANEL
Chol/HDL Ratio: 3.2 ratio (ref 0.0–5.0)
Cholesterol, Total: 89 mg/dL — ABNORMAL LOW (ref 100–199)
HDL: 28 mg/dL — ABNORMAL LOW (ref >39)
LDL Chol Calc (NIH): 34 mg/dL (ref 0–99)
Triglycerides: 160 mg/dL — ABNORMAL HIGH (ref 0–149)
VLDL Cholesterol Cal: 27 mg/dL (ref 5–40)

## 2023-09-29 LAB — HIV ANTIBODY (ROUTINE TESTING W REFLEX): HIV Screen 4th Generation wRfx: NONREACTIVE

## 2023-09-29 LAB — RPR: RPR Ser Ql: NONREACTIVE

## 2023-10-01 NOTE — Progress Notes (Signed)
 Established patient visit  Patient: Robert Whitaker   DOB: February 01, 1991   33 y.o. Male  MRN: [id number] Visit Date: 09/28/2023  Today's healthcare provider: Blane Bunting, PA-C   Chief Complaint  Patient presents with   Follow-up    3 Month f/u No other concerns.   Subjective     Discussed the use of AI scribe software for clinical note transcription with the patient, who gave verbal consent to proceed.  History of Present Illness Robert Whitaker is a 33 year old male with hypertension and diabetes who presents for a follow-up visit.  He is experiencing challenges with weight management, with recent weight gain despite diet and exercise efforts, including weightlifting. He has not consulted a nutritionist due to a recent move to [place]. He is trying to cook more at home but struggles with portion control and snacking, particularly on chips and popcorn, and is considering healthier alternatives.  He takes Hyzaar 125 mg once daily for hypertension and Metformin  500 mg twice daily for diabetes. He has not been monitoring his blood sugar at home but feels his diabetes is well-managed, with his last A1c at 6.3, down from 6.6. He manages cholesterol through diet and plans to follow up on a previous inconclusive blood work result. No recent issues with gout.       09/28/2023    3:31 PM 03/19/2023    8:12 AM 11/24/2022    8:18 AM  Depression screen PHQ 2/9  Decreased Interest 0 0 0  Down, Depressed, Hopeless 1 0 0  PHQ - 2 Score 1 0 0  Altered sleeping 1 1 1   Tired, decreased energy 1 1 1   Change in appetite 0 1 1  Feeling bad or failure about yourself  0 0 0  Trouble concentrating 0 1 0  Moving slowly or fidgety/restless 0 0 0  Suicidal thoughts 0 0 0  PHQ-9 Score 3 4 3   Difficult doing work/chores Somewhat difficult  Not difficult at all      09/28/2023    3:31 PM 03/19/2023    8:12 AM  GAD 7 : Generalized Anxiety Score  Nervous, Anxious, on Edge 1 0  Control/stop  worrying 1 1  Worry too much - different things 1 1  Trouble relaxing 1 0  Restless 0 0  Easily annoyed or irritable 1 0  Afraid - awful might happen 0 0  Total GAD 7 Score 5 2  Anxiety Difficulty Somewhat difficult     Medications: Outpatient Medications Prior to Visit  Medication Sig   ACCU-CHEK GUIDE test strip Check blood glucose as directed up to 4 times a day.   Accu-Chek Softclix Lancets lancets Use as instructed check fasting blood glucose up to 4 times daily.   blood glucose meter kit and supplies Dispense based on patient and insurance preference.Check blood glucose fasting q am.. (FOR ICD-10 E10.9, E11.9).   ciclopirox (PENLAC) 8 % solution Apply topically.   diclofenac Sodium (VOLTAREN) 1 % GEL Apply topically 4 (four) times daily.   econazole nitrate 1 % cream Apply topically.   EPINEPHrine  0.3 mg/0.3 mL IJ SOAJ injection Inject 0.3 mg into the muscle as needed for anaphylaxis.   losartan -hydrochlorothiazide (HYZAAR) 100-25 MG tablet TAKE 1 TABLET BY MOUTH EVERY DAY   metFORMIN  (GLUCOPHAGE ) 500 MG tablet Take 1 tablet (500 mg total) by mouth 2 (two) times daily with a meal.   ondansetron  (ZOFRAN -ODT) 4 MG disintegrating tablet Take 1 tablet (4 mg total) by mouth every 8 (  eight) hours as needed for nausea or vomiting.   VITAMIN D PO Take 1 capsule by mouth daily.   No facility-administered medications prior to visit.    Review of Systems All negative Except see HPI       Objective    BP 122/69 (BP Location: Left Arm, Patient Position: Sitting)   Pulse 94   Resp 16   Ht 5\' 11"  (1.803 m)   Wt (!) 344 lb (156 kg)   SpO2 100%   BMI 47.98 kg/m     Physical Exam Vitals reviewed.  Constitutional:      General: He is not in acute distress.    Appearance: Normal appearance. He is not diaphoretic.  HENT:     Head: Normocephalic and atraumatic.  Eyes:     General: No scleral icterus.    Conjunctiva/sclera: Conjunctivae normal.  Cardiovascular:     Rate and  Rhythm: Normal rate and regular rhythm.     Pulses: Normal pulses.     Heart sounds: Normal heart sounds. No murmur heard. Pulmonary:     Effort: Pulmonary effort is normal. No respiratory distress.     Breath sounds: Normal breath sounds. No wheezing or rhonchi.  Musculoskeletal:     Cervical back: Neck supple.     Right lower leg: No edema.     Left lower leg: No edema.  Lymphadenopathy:     Cervical: No cervical adenopathy.  Skin:    General: Skin is warm and dry.     Findings: No rash.  Neurological:     Mental Status: He is alert and oriented to person, place, and time. Mental status is at baseline.  Psychiatric:        Mood and Affect: Mood normal.        Behavior: Behavior normal.      Results for orders placed or performed in visit on 09/28/23  Hemoglobin A1c  Result Value Ref Range   Hgb A1c MFr Bld 6.8 (H) 4.8 - 5.6 %   Est. average glucose Bld gHb Est-mCnc 148 mg/dL  Lipid panel  Result Value Ref Range   Cholesterol, Total 89 (L) 100 - 199 mg/dL   Triglycerides 409 (H) 0 - 149 mg/dL   HDL 28 (L) >81 mg/dL   VLDL Cholesterol Cal 27 5 - 40 mg/dL   LDL Chol Calc (NIH) 34 0 - 99 mg/dL   Chol/HDL Ratio 3.2 0.0 - 5.0 ratio  Basic metabolic panel with GFR  Result Value Ref Range   Glucose 92 70 - 99 mg/dL   BUN 14 6 - 20 mg/dL   Creatinine, Ser 1.91 0.76 - 1.27 mg/dL   eGFR 94 >47 WG/NFA/2.13   BUN/Creatinine Ratio 13 9 - 20   Sodium 139 134 - 144 mmol/L   Potassium 3.7 3.5 - 5.2 mmol/L   Chloride 99 96 - 106 mmol/L   CO2 24 20 - 29 mmol/L   Calcium 9.7 8.7 - 10.2 mg/dL  RPR  Result Value Ref Range   RPR Ser Ql Non Reactive Non Reactive  HIV antibody (with reflex)  Result Value Ref Range   HIV Screen 4th Generation wRfx Non Reactive Non Reactive        Assessment & Plan Diabetes mellitus type 2 Chronic Diabetes well-controlled, A1c decreased from 6.6 to 6.3. - Continue metformin  500 mg twice daily. - Order blood work for diabetes monitoring. - Check  blood sugar at home regularly. Adhere to low carb diet  Hypertension Chronic Blood  pressure well-controlled with Hyzaar 125 mg daily. - Continue Hyzaar 125 mg daily. Continue with low salt diet  Morbid Obesity Chronic Assc with dmii, htn Weight fluctuations noted, engaged in exercise, diet concerns with portion control and snacking. - Refer to nutritionist in regional area. - Discuss dietary modifications, including reducing portion sizes and replacing chips with healthier alternatives.  Tinea pedis Improvement with topical treatment, declined oral medication due to hepatic side effects. - Continue topical treatment for athlete's foot.  Morbid obesity (HCC) (Primary) - Amb ref to Medical Nutrition Therapy-MNT  Diabetes mellitus without complication (HCC) - Amb ref to Medical Nutrition Therapy-MNT - Hemoglobin A1c - Lipid panel - Basic metabolic panel with GFR - RPR - HIV antibody (with reflex)   Encounter for assessment of STD exposure - Urine cytology ancillary only   Orders Placed This Encounter  Procedures   Hemoglobin A1c   Lipid panel    Has the patient fasted?:   Yes   Basic metabolic panel with GFR    Has the patient fasted?:   Yes   RPR   HIV antibody (with reflex)   Amb ref to Medical Nutrition Therapy-MNT    Referral Priority:   Routine    Referral Type:   Consultation    Referral Reason:   Specialty Services Required    Requested Specialty:   Nutrition    Number of Visits Requested:   1    Return in about 3 months (around 12/29/2023) for chronic disease f/u, CPE.   The patient was advised to call back or seek an in-person evaluation if the symptoms worsen or if the condition fails to improve as anticipated.  I discussed the assessment and treatment plan with the patient. The patient was provided an opportunity to ask questions and all were answered. The patient agreed with the plan and demonstrated an understanding of the instructions.  I, Arieana Somoza, PA-C have reviewed all documentation for this visit. The documentation on 09/28/2023  for the exam, diagnosis, procedures, and orders are all accurate and complete.  Blane Bunting, [facility], MMS [facility] [phone] (phone) [phone] (fax)  [facility]

## 2023-10-03 ENCOUNTER — Ambulatory Visit: Payer: Self-pay | Admitting: Physician Assistant

## 2023-10-03 LAB — URINE CYTOLOGY ANCILLARY ONLY
Chlamydia: NEGATIVE
Comment: NEGATIVE
Comment: NEGATIVE
Comment: NORMAL
Neisseria Gonorrhea: NEGATIVE
Trichomonas: NEGATIVE

## 2023-10-05 ENCOUNTER — Telehealth: Payer: Self-pay | Admitting: Physician Assistant

## 2023-10-05 DIAGNOSIS — E1159 Type 2 diabetes mellitus with other circulatory complications: Secondary | ICD-10-CM

## 2023-10-05 MED ORDER — LOSARTAN POTASSIUM-HCTZ 100-25 MG PO TABS: 1.0000 | ORAL_TABLET | Freq: Every day | ORAL | 0 refills | Status: AC

## 2023-10-05 NOTE — Telephone Encounter (Signed)
[facility] faxed refill request for the following medications:  losartan-hydrochlorothiazide (HYZAAR) 100-25 MG tablet   Please advise.

## 2023-10-25 ENCOUNTER — Encounter: Attending: Physician Assistant | Admitting: Nutrition

## 2023-10-25 VITALS — Ht 71.0 in | Wt 349.0 lb

## 2023-10-25 DIAGNOSIS — E119 Type 2 diabetes mellitus without complications: Secondary | ICD-10-CM | POA: Diagnosis not present

## 2023-10-25 NOTE — Progress Notes (Signed)
 Medical Nutrition Therapy  Appointment Start time:  [phone]  Appointment End time:  0930  Primary concerns today: DM Type 2  Referral diagnosis: E11.8 Preferred learning style: See  Learning readiness: Ready    NUTRITION ASSESSMENT  33 yr old bmale referred for Type 2 DM, A1C 6.8% Not testing blood sugars regularly. Has a desk job. Eats out often, cooks at home some. Works out some. He wants to lose weight, reverse his DM and get healthier. He is willing to work on Lifestyle Medicine to improve his health.  Clinical Medical Hx:  Past Medical History:  Diagnosis Date   Diabetes mellitus without complication (HCC)     Medications:  Current Outpatient Medications on File Prior to Visit  Medication Sig Dispense Refill   ACCU-CHEK GUIDE test strip Check blood glucose as directed up to 4 times a day. 200 each 6   Accu-Chek Softclix Lancets lancets Use as instructed check fasting blood glucose up to 4 times daily. 200 each 6   blood glucose meter kit and supplies Dispense based on patient and insurance preference.Check blood glucose fasting q am.. (FOR ICD-10 E10.9, E11.9). 1 each 0   ciclopirox (PENLAC) 8 % solution Apply topically.     diclofenac Sodium (VOLTAREN) 1 % GEL Apply topically 4 (four) times daily.     econazole nitrate 1 % cream Apply topically.     EPINEPHrine  0.3 mg/0.3 mL IJ SOAJ injection Inject 0.3 mg into the muscle as needed for anaphylaxis. 1 each 1   losartan -hydrochlorothiazide (HYZAAR) 100-25 MG tablet Take 1 tablet by mouth daily. 90 tablet 0   metFORMIN  (GLUCOPHAGE ) 500 MG tablet Take 1 tablet (500 mg total) by mouth 2 (two) times daily with a meal. 180 tablet 3   ondansetron  (ZOFRAN -ODT) 4 MG disintegrating tablet Take 1 tablet (4 mg total) by mouth every 8 (eight) hours as needed for nausea or vomiting. 20 tablet 0   VITAMIN D PO Take 1 capsule by mouth daily.     No current facility-administered medications on file prior to visit.    Labs:  Lab Results   Component Value Date   HGBA1C 6.8 (H) 09/28/2023      Latest Ref Rng & Units 09/28/2023    4:04 PM 03/19/2023    9:57 AM 06/22/2022   11:25 AM  CMP  Glucose 70 - 99 mg/dL 92  CANCELED  191   BUN 6 - 20 mg/dL 14  CANCELED  12   Creatinine 0.76 - 1.27 mg/dL 4.78  CANCELED  2.95   Sodium 134 - 144 mmol/L 139  CANCELED  141   Potassium 3.5 - 5.2 mmol/L 3.7  CANCELED  4.7   Chloride 96 - 106 mmol/L 99  CANCELED  103   CO2 20 - 29 mmol/L 24  CANCELED  23   Calcium 8.7 - 10.2 mg/dL 9.7  CANCELED  9.6   Total Protein 6.0 - 8.5 g/dL   7.4   Total Bilirubin 0.0 - 1.2 mg/dL   0.4   Alkaline Phos 44 - 121 IU/L   73   AST 0 - 40 IU/L   20   ALT 0 - 44 IU/L   26    Lipid Panel     Component Value Date/Time   CHOL 89 (L) 09/28/2023 1604   TRIG 160 (H) 09/28/2023 1604   HDL 28 (L) 09/28/2023 1604   CHOLHDL 3.2 09/28/2023 1604   LDLCALC 34 09/28/2023 1604   LABVLDL 27 09/28/2023 1604  Notable Signs/Symptoms: None  Lifestyle & Dietary Hx Lives by himself.  Estimated daily fluid intake: 40-60 oz Supplements:  Sleep:  Stress / self-care:  Current average weekly physical activity: works out some  24-Hr Dietary Recall Eats out some and at home.   Estimated Energy Needs Calories: 2000-2200 Carbohydrate: 248g Protein: 165g Fat: 61g   NUTRITION DIAGNOSIS  NB-1.1 Food and nutrition-related knowledge deficit As related to high calorie high fast diet causing excessive weight gain and elevated blood sugars.  As evidenced by BMI 48 and A1C 6.8%..   NUTRITION INTERVENTION  Nutrition education (E-1) on the following topics:  Nutrition and Diabetes education provided on My Plate, CHO counting, meal planning, portion sizes, timing of meals, avoiding snacks between meals unless having a low blood sugar, target ranges for A1C and blood sugars, signs/symptoms and treatment of hyper/hypoglycemia, monitoring blood sugars, taking medications as prescribed, benefits of exercising 30 minutes per  day and prevention of complications of DM. Lifestyle Medicine  - Whole Food, Plant Predominant Nutrition is highly recommended: Eat Plenty of vegetables, Mushrooms, fruits, Legumes, Whole Grains, Nuts, seeds in lieu of processed meats, processed snacks/pastries red meat, poultry, eggs.    -It is better to avoid simple carbohydrates including: Cakes, Sweet Desserts, Ice Cream, Soda (diet and regular), Sweet Tea, Candies, Chips, Cookies, Store Bought Juices, Alcohol in Excess of  1-2 drinks a day, Lemonade,  Artificial Sweeteners, Doughnuts, Coffee Creamers, Sugar-free Products, etc, etc.  This is not a complete list.....  Exercise: If you are able: 30 -60 minutes a day ,4 days a week, or 150 minutes a week.  The longer the better.  Combine stretch, strength, and aerobic activities.  If you were told in the past that you have high risk for cardiovascular diseases, you may seek evaluation by your heart doctor prior to initiating moderate to intense exercise programs.   Handouts Provided Include  Lifestyle Medicine Know your numbers What is Diabetes?  Learning Style & Readiness for Change Teaching method utilized: Visual & Auditory  Demonstrated degree of understanding via: Teach Back  Barriers to learning/adherence to lifestyle change: none  Goals Established by Pt Goals  Eat 3 meals per day B) 6-8 L012-2 D) 5-7 Eat 2 vegetables with lunch and 1 with supper Eat a piece of fruit with each meals Reduce fried foods and eating to 2 times per  week   MONITORING & EVALUATION Dietary intake, weekly physical activity, and weight and blood sugars in 1 month.  Next Steps  Patient is to work on meal planning and meal prepping.Robert Whitaker

## 2023-10-25 NOTE — Patient Instructions (Signed)
 Goals  Eat 3 meals per day B) 6-8 L012-2 D) 5-7 Eat 2 vegetables with lunch and 1 with supper Eat a piece of fruit with each meals Reduce fried foods and eating to 2 times per  week

## 2023-11-26 ENCOUNTER — Encounter: Payer: Self-pay | Admitting: Nutrition

## 2023-11-26 ENCOUNTER — Encounter: Attending: Physician Assistant | Admitting: Nutrition

## 2023-11-26 VITALS — Ht 71.0 in | Wt 344.8 lb

## 2023-11-26 DIAGNOSIS — E119 Type 2 diabetes mellitus without complications: Secondary | ICD-10-CM | POA: Insufficient documentation

## 2023-11-26 NOTE — Patient Instructions (Addendum)
 Goals Keep up the great job! Keep tracking foods in Myfitness Pal Track exercise in app Go to [facility] 3 times per week Keep measuring foods out. Lose 1 lb per week Walk 2 miles twice a week

## 2023-11-26 NOTE — Progress Notes (Signed)
 Medical Nutrition Therapy  Appointment Start time:  [phone]  Appointment End time:  0831  Primary concerns today: DM Type 2  Referral diagnosis: E11.8 Preferred learning style: See  Learning readiness: Ready    NUTRITION ASSESSMENT  Follow up Has been swimming 2-3 times at the [facility].   Has been using the Myfitnesss Pal to track foods. Has been weighing his food out for portion control. Walking some on weekends. Metformin  500 mg BID. FBSs 100-110's  Bedtime 90-130's mg/dl.  Trying to be more intentional with better food choices, eating out less. Lost 5 lbs. Goals set previous visit.  Eat 3 meals per day B) 6-8 L012-2 D) 5-7 - done Eat 2 vegetables with lunch and 1 with supper- work in progress. Eat a piece of fruit with each meals-done Reduce fried foods and eating  out to 2 times per  week- done  Clinical Medical Hx:  Past Medical History:  Diagnosis Date   Diabetes mellitus without complication (HCC)     Medications:  Current Outpatient Medications on File Prior to Visit  Medication Sig Dispense Refill   ACCU-CHEK GUIDE test strip Check blood glucose as directed up to 4 times a day. 200 each 6   Accu-Chek Softclix Lancets lancets Use as instructed check fasting blood glucose up to 4 times daily. 200 each 6   blood glucose meter kit and supplies Dispense based on patient and insurance preference.Check blood glucose fasting q am.. (FOR ICD-10 E10.9, E11.9). 1 each 0   ciclopirox (PENLAC) 8 % solution Apply topically.     diclofenac Sodium (VOLTAREN) 1 % GEL Apply topically 4 (four) times daily.     econazole nitrate 1 % cream Apply topically.     EPINEPHrine  0.3 mg/0.3 mL IJ SOAJ injection Inject 0.3 mg into the muscle as needed for anaphylaxis. 1 each 1   losartan -hydrochlorothiazide (HYZAAR) 100-25 MG tablet Take 1 tablet by mouth daily. 90 tablet 0   metFORMIN  (GLUCOPHAGE ) 500 MG tablet Take 1 tablet (500 mg total) by mouth 2 (two) times daily with a meal. 180 tablet 3    ondansetron  (ZOFRAN -ODT) 4 MG disintegrating tablet Take 1 tablet (4 mg total) by mouth every 8 (eight) hours as needed for nausea or vomiting. 20 tablet 0   VITAMIN D PO Take 1 capsule by mouth daily.     No current facility-administered medications on file prior to visit.    Labs:  Lab Results  Component Value Date   HGBA1C 6.8 (H) 09/28/2023      Latest Ref Rng & Units 09/28/2023    4:04 PM 03/19/2023    9:57 AM 06/22/2022   11:25 AM  CMP  Glucose 70 - 99 mg/dL 92  CANCELED  861   BUN 6 - 20 mg/dL 14  CANCELED  12   Creatinine 0.76 - 1.27 mg/dL 8.92  CANCELED  8.96   Sodium 134 - 144 mmol/L 139  CANCELED  141   Potassium 3.5 - 5.2 mmol/L 3.7  CANCELED  4.7   Chloride 96 - 106 mmol/L 99  CANCELED  103   CO2 20 - 29 mmol/L 24  CANCELED  23   Calcium 8.7 - 10.2 mg/dL 9.7  CANCELED  9.6   Total Protein 6.0 - 8.5 g/dL   7.4   Total Bilirubin 0.0 - 1.2 mg/dL   0.4   Alkaline Phos 44 - 121 IU/L   73   AST 0 - 40 IU/L   20   ALT 0 -  44 IU/L   26    Lipid Panel     Component Value Date/Time   CHOL 89 (L) 09/28/2023 1604   TRIG 160 (H) 09/28/2023 1604   HDL 28 (L) 09/28/2023 1604   CHOLHDL 3.2 09/28/2023 1604   LDLCALC 34 09/28/2023 1604   LABVLDL 27 09/28/2023 1604    Notable Signs/Symptoms: None  Lifestyle & Dietary Hx Lives by himself.  Estimated daily fluid intake: 40-60 oz Supplements:  Sleep:  Stress / self-care:  Current average weekly physical activity: works out some  24-Hr Dietary Recall Eats out some and at home.   Estimated Energy Needs Calories: 2000-2200 Carbohydrate: 248g Protein: 165g Fat: 61g   NUTRITION DIAGNOSIS  NB-1.1 Food and nutrition-related knowledge deficit As related to high calorie high fast diet causing excessive weight gain and elevated blood sugars.  As evidenced by BMI 48 and A1C 6.8%..   NUTRITION INTERVENTION  Nutrition education (E-1) on the following topics:  Nutrition and Diabetes education provided on My Plate, CHO  counting, meal planning, portion sizes, timing of meals, avoiding snacks between meals unless having a low blood sugar, target ranges for A1C and blood sugars, signs/symptoms and treatment of hyper/hypoglycemia, monitoring blood sugars, taking medications as prescribed, benefits of exercising 30 minutes per day and prevention of complications of DM. Lifestyle Medicine  - Whole Food, Plant Predominant Nutrition is highly recommended: Eat Plenty of vegetables, Mushrooms, fruits, Legumes, Whole Grains, Nuts, seeds in lieu of processed meats, processed snacks/pastries red meat, poultry, eggs.    -It is better to avoid simple carbohydrates including: Cakes, Sweet Desserts, Ice Cream, Soda (diet and regular), Sweet Tea, Candies, Chips, Cookies, Store Bought Juices, Alcohol in Excess of  1-2 drinks a day, Lemonade,  Artificial Sweeteners, Doughnuts, Coffee Creamers, Sugar-free Products, etc, etc.  This is not a complete list.....  Exercise: If you are able: 30 -60 minutes a day ,4 days a week, or 150 minutes a week.  The longer the better.  Combine stretch, strength, and aerobic activities.  If you were told in the past that you have high risk for cardiovascular diseases, you may seek evaluation by your heart doctor prior to initiating moderate to intense exercise programs.   Handouts Provided Include  Lifestyle Medicine Know your numbers What is Diabetes?  Learning Style & Readiness for Change Teaching method utilized: Visual & Auditory  Demonstrated degree of understanding via: Teach Back  Barriers to learning/adherence to lifestyle change: none  Goals Established by Pt Goals Keep up the great job! Keep tracking foods in Myfitness Pal Track exercise in app Go to [facility] 3 times per week Keep measuring foods out. Lose 1 lb per week Walk 2 miles twice a week   MONITORING & EVALUATION Dietary intake, weekly physical activity, and weight and blood sugars in 3 month.  Next Steps  Patient is  to work on meal planning and meal prepping.Robert Whitaker

## 2024-01-04 ENCOUNTER — Ambulatory Visit: Payer: BC Managed Care – PPO | Admitting: Urology

## 2024-01-04 ENCOUNTER — Encounter: Payer: Self-pay | Admitting: Urology

## 2024-01-04 VITALS — BP 151/91 | HR 91 | Ht 71.0 in | Wt 340.0 lb

## 2024-01-04 DIAGNOSIS — R310 Gross hematuria: Secondary | ICD-10-CM | POA: Diagnosis not present

## 2024-01-04 LAB — URINALYSIS, ROUTINE W REFLEX MICROSCOPIC
Bilirubin, UA: NEGATIVE
Glucose, UA: NEGATIVE
Ketones, UA: NEGATIVE
Leukocytes,UA: NEGATIVE
Nitrite, UA: NEGATIVE
Protein,UA: NEGATIVE
Specific Gravity, UA: 1.025 (ref 1.005–1.030)
Urobilinogen, Ur: 0.2 mg/dL (ref 0.2–1.0)
pH, UA: 6.5 (ref 5.0–7.5)

## 2024-01-04 LAB — MICROSCOPIC EXAMINATION: Mucus, UA: POSITIVE — AB

## 2024-01-04 NOTE — Progress Notes (Signed)
   Assessment: 1. Gross hematuria     Plan: Return to office prn  Chief Complaint:  Chief Complaint  Patient presents with   Hematuria    History of Present Illness:  Robert Whitaker is a 33 y.o. male who is seen for further evaluation of gross hematuria. He reported a history of intermittent hematuria on 3 separate occasions.  The first occasion was approximately 1 year ago.  An episode occurred on 05/13/2023.  He noted blood in the toilet while voiding.  He also had some dysuria.  This lasted 1 void and his symptoms resolved.  He associated these episodes with straining with a bowel movement.  No lower urinary tract symptoms. He was evaluated at urgent care on 05/13/2023 for gross hematuria and dysuria. Dipstick urinalysis showed small blood. Urine culture was negative.  STD testing negative. No history of kidney stones or UTIs. IPSS = 1. Urinalysis from 1/25 was negative for blood. CT hematuria protocol obtained on 06/22/2023 showed no renal or ureteral calculi, no renal masses, no obstruction or filling defect. CxBladder from 2/25 was normal with NPV of 97%.  He returns today for follow-up.  He reported 1 episode of gross hematuria in March 2025.  He associated this episode with straining to avoid urination.  No recent episodes of hematuria.  No urinary symptoms other than nocturia x 2. IPSS = 2/0.  Portions of the above documentation were copied from a prior visit for review purposes only.   Past Medical History:  Past Medical History:  Diagnosis Date   Diabetes mellitus without complication (HCC)     Past Surgical History:  Past Surgical History:  Procedure Laterality Date   KNEE ARTHROSCOPY Right     Allergies:  Allergies  Allergen Reactions   Bee Venom Swelling, Anaphylaxis, Hives, Itching, Rash and Shortness Of Breath    Family History:  No family history on file.  Social History:  Social History   Tobacco Use   Smoking status: Never   Smokeless tobacco:  Never  Vaping Use   Vaping status: Never Used  Substance Use Topics   Alcohol use: Yes    Comment: socially   Drug use: Never    ROS: Constitutional:  Negative for fever, chills, weight loss CV: Negative for chest pain, previous MI, hypertension Respiratory:  Negative for shortness of breath, wheezing, sleep apnea, frequent cough GI:  Negative for nausea, vomiting, bloody stool, GERD  Physical exam: BP (!) 151/91   Pulse 91   Ht 5' 11 (1.803 m)   Wt (!) 340 lb (154.2 kg)   BMI 47.42 kg/m  GENERAL APPEARANCE:  Well appearing, well developed, well nourished, NAD HEENT:  Atraumatic, normocephalic, oropharynx clear NECK:  Supple without lymphadenopathy or thyromegaly ABDOMEN:  Soft, non-tender, no masses EXTREMITIES:  Moves all extremities well, without clubbing, cyanosis, or edema NEUROLOGIC:  Alert and oriented x 3, normal gait, CN II-XII grossly intact MENTAL STATUS:  appropriate BACK:  Non-tender to palpation, No CVAT SKIN:  Warm, dry, and intact   Results: U/A: negative

## 2024-01-11 ENCOUNTER — Encounter: Admitting: Physician Assistant

## 2024-01-14 ENCOUNTER — Ambulatory Visit: Admitting: Nutrition

## 2024-02-08 ENCOUNTER — Encounter: Admitting: Physician Assistant

## 2024-02-11 ENCOUNTER — Encounter: Admitting: Nutrition

## 2024-02-21 ENCOUNTER — Encounter: Payer: Self-pay | Admitting: Physician Assistant

## 2024-02-25 ENCOUNTER — Encounter: Admitting: Physician Assistant

## 2024-03-04 ENCOUNTER — Encounter: Admitting: Physician Assistant

## 2024-03-31 ENCOUNTER — Encounter: Admitting: Physician Assistant

## 2024-03-31 DIAGNOSIS — E119 Type 2 diabetes mellitus without complications: Secondary | ICD-10-CM

## 2024-05-23 ENCOUNTER — Encounter: Admitting: Physician Assistant

## 2024-05-23 DIAGNOSIS — Z23 Encounter for immunization: Secondary | ICD-10-CM

## 2024-05-23 DIAGNOSIS — Z6841 Body Mass Index (BMI) 40.0 and over, adult: Secondary | ICD-10-CM

## 2024-05-23 DIAGNOSIS — E786 Lipoprotein deficiency: Secondary | ICD-10-CM

## 2024-05-23 DIAGNOSIS — Z0000 Encounter for general adult medical examination without abnormal findings: Secondary | ICD-10-CM

## 2024-05-23 DIAGNOSIS — E1159 Type 2 diabetes mellitus with other circulatory complications: Secondary | ICD-10-CM

## 2024-05-23 DIAGNOSIS — R7309 Other abnormal glucose: Secondary | ICD-10-CM

## 2024-05-23 DIAGNOSIS — E1165 Type 2 diabetes mellitus with hyperglycemia: Secondary | ICD-10-CM

## 2024-07-11 ENCOUNTER — Encounter: Admitting: Physician Assistant
